# Patient Record
Sex: Male | Born: 1960 | Race: White | Hispanic: No | Smoking: Never smoker
Health system: Southern US, Community
[De-identification: ages and names within clinical notes are randomized; demographics above are authoritative.]

## PROBLEM LIST (undated history)

## (undated) DIAGNOSIS — D649 Anemia, unspecified: Secondary | ICD-10-CM

## (undated) DIAGNOSIS — D509 Iron deficiency anemia, unspecified: Secondary | ICD-10-CM

## (undated) DIAGNOSIS — K219 Gastro-esophageal reflux disease without esophagitis: Secondary | ICD-10-CM

## (undated) DIAGNOSIS — N200 Calculus of kidney: Secondary | ICD-10-CM

## (undated) DIAGNOSIS — I10 Essential (primary) hypertension: Secondary | ICD-10-CM

## (undated) HISTORY — DX: Anemia, unspecified: D64.9

## (undated) HISTORY — DX: Gastro-esophageal reflux disease without esophagitis: K21.9

## (undated) HISTORY — PX: OTHER SURGICAL HISTORY: SHX169

## (undated) HISTORY — DX: Iron deficiency anemia, unspecified: D50.9

## (undated) HISTORY — DX: Essential (primary) hypertension: I10

## (undated) HISTORY — DX: Calculus of kidney: N20.0

---

## 1999-02-13 ENCOUNTER — Ambulatory Visit (HOSPITAL_COMMUNITY): Admission: RE | Admit: 1999-02-13 | Discharge: 1999-02-13 | Payer: Self-pay | Admitting: Urology

## 1999-02-13 ENCOUNTER — Encounter: Payer: Self-pay | Admitting: Urology

## 2013-03-29 ENCOUNTER — Ambulatory Visit (INDEPENDENT_AMBULATORY_CARE_PROVIDER_SITE_OTHER): Payer: BC Managed Care – PPO | Admitting: Physician Assistant

## 2013-03-29 VITALS — BP 138/96 | HR 80 | Temp 97.4°F | Resp 20 | Ht 68.0 in | Wt 206.0 lb

## 2013-03-29 DIAGNOSIS — J309 Allergic rhinitis, unspecified: Secondary | ICD-10-CM

## 2013-03-29 MED ORDER — PREDNISONE 20 MG PO TABS: ORAL_TABLET | ORAL | Status: DC

## 2013-03-29 MED ORDER — AZELASTINE HCL 0.05 % OP SOLN: 1.0000 [drp] | Freq: Two times a day (BID) | OPHTHALMIC | Status: DC

## 2013-03-29 MED ORDER — CETIRIZINE HCL 10 MG PO TABS: 10.0000 mg | ORAL_TABLET | Freq: Every day | ORAL | Status: DC

## 2013-03-29 MED ORDER — FLUTICASONE PROPIONATE 50 MCG/ACT NA SUSP: 2.0000 | Freq: Every day | NASAL | Status: DC

## 2013-03-29 NOTE — Patient Instructions (Addendum)
Start the prednisone (oral steroid) today - take as directed and finish the full course.  This will calm down the allergic response you are having.  Take cetirizine (Zyrtec) every morning.  Also use the fluticasone (Flonase) every day - this medicine works best with consistent daily use.  Use the Optivar drops in both eyes twice daily to help with the itchy, watery eyes.  If any symptoms are worsening or not improving, please let us know   Allergic Rhinitis Allergic rhinitis is when the mucous membranes in the nose respond to allergens. Allergens are particles in the air that cause your body to have an allergic reaction. This causes you to release allergic antibodies. Through a chain of events, these eventually cause you to release histamine into the blood stream (hence the use of antihistamines). Although meant to be protective to the body, it is this release that causes your discomfort, such as frequent sneezing, congestion and an itchy runny nose.  CAUSES  The pollen allergens may come from grasses, trees, and weeds. This is seasonal allergic rhinitis, or "hay fever." Other allergens cause year-round allergic rhinitis (perennial allergic rhinitis) such as house dust mite allergen, pet dander and mold spores.  SYMPTOMS   Nasal stuffiness (congestion).  Runny, itchy nose with sneezing and tearing of the eyes.  There is often an itching of the mouth, eyes and ears. It cannot be cured, but it can be controlled with medications. DIAGNOSIS  If you are unable to determine the offending allergen, skin or blood testing may find it. TREATMENT   Avoid the allergen.  Medications and allergy shots (immunotherapy) can help.  Hay fever may often be treated with antihistamines in pill or nasal spray forms. Antihistamines block the effects of histamine. There are over-the-counter medicines that may help with nasal congestion and swelling around the eyes. Check with your caregiver before taking or  giving this medicine. If the treatment above does not work, there are many new medications your caregiver can prescribe. Stronger medications may be used if initial measures are ineffective. Desensitizing injections can be used if medications and avoidance fails. Desensitization is when a patient is given ongoing shots until the body becomes less sensitive to the allergen. Make sure you follow up with your caregiver if problems continue. SEEK MEDICAL CARE IF:   You develop fever (more than 100.5 F (38.1 C).  You develop a cough that does not stop easily (persistent).  You have shortness of breath.  You start wheezing.  Symptoms interfere with normal daily activities. Document Released: 04/07/2001 Document Revised: 10/05/2011 Document Reviewed: 10/17/2008 [facility] Patient Information 2014 [place], [place].

## 2013-03-29 NOTE — Progress Notes (Signed)
  Subjective:    Patient ID: Jackson Donaldson, male    DOB: 1961/06/10, 52 y.o.   MRN: [id number]  HPI   Jackson Donaldson is a very pleasant 52 yr old male here with concern for URI and/or allergies.  Reports he came down with a cold about 1 wk ago.  Think it's "turned into allergies."  Reports a history of sinus problems but has never been diagnosed with allergies.  Current symptoms include rhinorrhea, sneezing, itchy/watery eyes, ear pressure.  Has felt a little feverish but hasn't taken temp.  No HA or facial pain.  No ST or cough.  No wheezing or SOB.  Reports that being outside exacerbates symptoms.  Yesterday was out when someone was cutting grass and he experiences sneezing and worsening of eye symptoms.  Tried son's zyrtec yesterday - not sure if that helped.  Takes benadryl qhs for sleep at baseline.  Review of Systems  Constitutional: Positive for fever (subjective).  HENT: Positive for congestion, rhinorrhea and sneezing. Negative for ear pain, sore throat and sinus pressure.   Eyes: Positive for discharge, redness and itching.  Respiratory: Negative for cough, shortness of breath and wheezing.   Cardiovascular: Negative.   Gastrointestinal: Negative.   Musculoskeletal: Negative.   Skin: Negative.   Neurological: Negative.        Objective:   Physical Exam  Vitals reviewed. Constitutional: He is oriented to person, place, and time. He appears well-developed and well-nourished. No distress.  HENT:  Head: Normocephalic and atraumatic.  Right Ear: Tympanic membrane and ear canal normal.  Left Ear: Tympanic membrane and ear canal normal.  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 Right sinus exhibits no maxillary sinus tenderness and no frontal sinus tenderness. Left sinus exhibits no maxillary sinus tenderness and no frontal sinus tenderness.  Mouth/Throat: Uvula is midline, oropharynx is clear and moist and mucous membranes are normal.   Eyes: EOM are normal. Pupils are equal, round, and reactive to light. Right eye exhibits no exudate. Left eye exhibits no exudate. Right conjunctiva is injected. Left conjunctiva is injected.  Neck: Neck supple.  Cardiovascular: Normal rate, regular rhythm and normal heart sounds.   Pulmonary/Chest: Effort normal and breath sounds normal. He has no wheezes. He has no rales.  Lymphadenopathy:    He has no cervical adenopathy.  Neurological: He is alert and oriented to person, place, and time.  Skin: Skin is warm and dry.  Psychiatric: He has a normal mood and affect. His behavior is normal.        Assessment & Plan:  Allergic rhinitis - Plan: predniSONE (DELTASONE) 20 MG tablet, fluticasone (FLONASE) 50 MCG/ACT nasal spray, cetirizine (ZYRTEC) 10 MG tablet, azelastine (OPTIVAR) 0.05 % ophthalmic solution   Jackson Donaldson is a very pleasant 52 yr old male with allergic rhinitis.  No relief thus far with Zyrtec and Benadryl.  He is quite congested and having significant rhinorrhea and sneezing.  Additionally has allergic eye symptoms as well.  Will start prednisone taper today.  Also start Flonase and Optivar.  Continue daily Zyrtec and may continue nightly Benadryl.  Encouraged consistent daily use of Flonase for best results.  Discussed RTC precautions and the possibility of adding/changing allergy meds to optimize control of symptoms.  Pt understands and is in agreement with this plan.

## 2014-01-13 ENCOUNTER — Emergency Department (INDEPENDENT_AMBULATORY_CARE_PROVIDER_SITE_OTHER)
Admission: EM | Admit: 2014-01-13 | Discharge: 2014-01-13 | Disposition: A | Discharge status: Home / Self Care | Payer: Self-pay | Source: Home / Self Care | Attending: Family Medicine | Admitting: Family Medicine

## 2014-01-13 ENCOUNTER — Emergency Department (INDEPENDENT_AMBULATORY_CARE_PROVIDER_SITE_OTHER): Payer: Self-pay

## 2014-01-13 ENCOUNTER — Encounter (HOSPITAL_COMMUNITY): Payer: Self-pay | Admitting: Emergency Medicine

## 2014-01-13 DIAGNOSIS — F32A Depression, unspecified: Secondary | ICD-10-CM

## 2014-01-13 DIAGNOSIS — F3289 Other specified depressive episodes: Secondary | ICD-10-CM

## 2014-01-13 DIAGNOSIS — F329 Major depressive disorder, single episode, unspecified: Secondary | ICD-10-CM

## 2014-01-13 LAB — COMPREHENSIVE METABOLIC PANEL
ALT: 18 U/L (ref 0–53)
AST: 21 U/L (ref 0–37)
Albumin: 4.6 g/dL (ref 3.5–5.2)
Alkaline Phosphatase: 108 U/L (ref 39–117)
BILIRUBIN TOTAL: 0.7 mg/dL (ref 0.3–1.2)
BUN: 17 mg/dL (ref 6–23)
CHLORIDE: 100 meq/L (ref 96–112)
CO2: 22 meq/L (ref 19–32)
Calcium: 9.8 mg/dL (ref 8.4–10.5)
Creatinine, Ser: 0.96 mg/dL (ref 0.50–1.35)
GFR calc Af Amer: >90 mL/min (ref >90)
Glucose, Bld: 90 mg/dL (ref 70–99)
Potassium: 4 mEq/L (ref 3.7–5.3)
Sodium: 140 mEq/L (ref 137–147)
Total Protein: 9 g/dL — ABNORMAL HIGH (ref 6.0–8.3)

## 2014-01-13 LAB — CBC WITH DIFFERENTIAL/PLATELET
BASOS ABS: 0 10*3/uL (ref 0.0–0.1)
Basophils Relative: 0 % (ref 0–1)
Eosinophils Absolute: 0.1 10*3/uL (ref 0.0–0.7)
Eosinophils Relative: 1 % (ref 0–5)
HCT: 46.2 % (ref 39.0–52.0)
Hemoglobin: 16 g/dL (ref 13.0–17.0)
LYMPHS ABS: 1.2 10*3/uL (ref 0.7–4.0)
LYMPHS PCT: 24 % (ref 12–46)
MCH: 29.7 pg (ref 26.0–34.0)
MCHC: 34.6 g/dL (ref 30.0–36.0)
MCV: 85.7 fL (ref 78.0–100.0)
MONO ABS: 0.4 10*3/uL (ref 0.1–1.0)
Monocytes Relative: 7 % (ref 3–12)
NEUTROS ABS: 3.4 10*3/uL (ref 1.7–7.7)
Neutrophils Relative %: 68 % (ref 43–77)
Platelets: 197 10*3/uL (ref 150–400)
RBC: 5.39 MIL/uL (ref 4.22–5.81)
RDW: 14.1 % (ref 11.5–15.5)
WBC: 5 10*3/uL (ref 4.0–10.5)

## 2014-01-13 LAB — HEMOGLOBIN A1C
HEMOGLOBIN A1C: 5.6 % (ref <5.7)
Mean Plasma Glucose: 114 mg/dL (ref <117)

## 2014-01-13 LAB — POCT URINALYSIS DIP (DEVICE)
Bilirubin Urine: NEGATIVE
Glucose, UA: NEGATIVE mg/dL
Hgb urine dipstick: NEGATIVE
LEUKOCYTES UA: NEGATIVE
Nitrite: NEGATIVE
PH: 5.5 (ref 5.0–8.0)
PROTEIN: 30 mg/dL — AB
Specific Gravity, Urine: >=1.03 (ref 1.005–1.030)
Urobilinogen, UA: 0.2 mg/dL (ref 0.0–1.0)

## 2014-01-13 LAB — SEDIMENTATION RATE: Sed Rate: 1 mm/hr (ref 0–16)

## 2014-01-13 MED ORDER — CITALOPRAM HYDROBROMIDE 20 MG PO TABS: 20.0000 mg | ORAL_TABLET | Freq: Every day | ORAL | Status: DC

## 2014-01-13 NOTE — Discharge Instructions (Signed)
Depression, Adult Depression refers to feeling sad, low, down in the dumps, blue, gloomy, or empty. In general, there are two kinds of depression: 1. Depression that we all experience from time to time because of upsetting life experiences, including the loss of a job or the ending of a relationship (normal sadness or normal grief). This kind of depression is considered normal, is short lived, and resolves within a few days to 2 weeks. (Depression experienced after the loss of a loved one is called bereavement. Bereavement often lasts longer than 2 weeks but normally gets better with time.) 2. Clinical depression, which lasts longer than normal sadness or normal grief or interferes with your ability to function at home, at work, and in school. It also interferes with your personal relationships. It affects almost every aspect of your life. Clinical depression is an illness. Symptoms of depression also can be caused by conditions other than normal sadness and grief or clinical depression. Examples of these conditions are listed as follows:  Physical illness--Some physical illnesses, including underactive thyroid gland (hypothyroidism), severe anemia, specific types of cancer, diabetes, uncontrolled seizures, heart and lung problems, strokes, and chronic pain are commonly associated with symptoms of depression.  Side effects of some prescription medicine--In some people, certain types of prescription medicine can cause symptoms of depression.  Substance abuse--Abuse of alcohol and illicit drugs can cause symptoms of depression. SYMPTOMS Symptoms of normal sadness and normal grief include the following:  Feeling sad or crying for short periods of time.  Not caring about anything (apathy).  Difficulty sleeping or sleeping too much.  No longer able to enjoy the things you used to enjoy.  Desire to be by oneself all the time (social isolation).  Lack of energy or motivation.  Difficulty  concentrating or remembering.  Change in appetite or weight.  Restlessness or agitation. Symptoms of clinical depression include the same symptoms of normal sadness or normal grief and also the following symptoms:  Feeling sad or crying all the time.  Feelings of guilt or worthlessness.  Feelings of hopelessness or helplessness.  Thoughts of suicide or the desire to harm yourself (suicidal ideation).  Loss of touch with reality (psychotic symptoms). Seeing or hearing things that are not real (hallucinations) or having false beliefs about your life or the people around you (delusions and paranoia). DIAGNOSIS  The diagnosis of clinical depression usually is based on the severity and duration of the symptoms. Your caregiver also will ask you questions about your medical history and substance use to find out if physical illness, use of prescription medicine, or substance abuse is causing your depression. Your caregiver also may order blood tests. TREATMENT  Typically, normal sadness and normal grief do not require treatment. However, sometimes antidepressant medicine is prescribed for bereavement to ease the depressive symptoms until they resolve. The treatment for clinical depression depends on the severity of your symptoms but typically includes antidepressant medicine, counseling with a mental health professional, or a combination of both. Your caregiver will help to determine what treatment is best for you. Depression caused by physical illness usually goes away with appropriate medical treatment of the illness. If prescription medicine is causing depression, talk with your caregiver about stopping the medicine, decreasing the dose, or substituting another medicine. Depression caused by abuse of alcohol or illicit drugs abuse goes away with abstinence from these substances. Some adults need professional help in order to stop drinking or using drugs. SEEK IMMEDIATE CARE IF:  You have thoughts  about   hurting yourself or others.  You lose touch with reality (have psychotic symptoms).  You are taking medicine for depression and have a serious side effect. FOR MORE INFORMATION National Alliance on Mental Illness: www.nami.org National Institute of Mental Health: www.nimh.nih.gov Document Released: 07/10/2000 Document Revised: 01/12/2012 Document Reviewed: 10/12/2011 [employer] Patient Information 2015 [employer]. This information is not intended to replace advice given to you by your health care provider. Make sure you discuss any questions you have with your health care provider.  

## 2014-01-13 NOTE — ED Provider Notes (Signed)
CSN: [id number]     Arrival date & time 01/13/14  1011 History   First MD Initiated Contact with Patient 01/13/14 1132     Chief Complaint  Patient presents with  . Headache  . Chest Pain   (Consider location/radiation/quality/duration/timing/severity/associated sxs/prior Treatment) HPI Comments: 53 year old male presents complaining of several months of fatigue, depression, dry mouth, intermittent chest tightness, headaches, sleepiness, intermittent alcohol abuse, dizziness when standing from a seated position. These symptoms have all been intermittent he separated from his wife of 35 years. He thinks that he may be depressed. He denies any other significant past medical history. He does not have a primary care doctor.  Patient is a 53 y.o. male presenting with headaches and chest pain.  Headache Chest Pain Associated symptoms: headache     History reviewed. No pertinent past medical history. History reviewed. No pertinent past surgical history. Family History  Problem Relation Age of Onset  . Diabetes Mother    History  Substance Use Topics  . Smoking status: Never Smoker   . Smokeless tobacco: Not on file  . Alcohol Use: Yes    Review of Systems  Cardiovascular: Positive for chest pain.  Neurological: Positive for headaches.    Allergies  Review of patient's allergies indicates no known allergies.  Home Medications   Prior to Admission medications   Medication Sig Start Date End Date Taking? Authorizing Provider  azelastine (OPTIVAR) 0.05 % ophthalmic solution Place 1 drop into both eyes 2 (two) times daily. 03/29/13   Eleanore Kurtis Bushman, PA-C  cetirizine (ZYRTEC) 10 MG tablet Take 1 tablet (10 mg total) by mouth daily. 03/29/13   Eleanore Kurtis Bushman, PA-C  citalopram (CELEXA) 20 MG tablet Take 1 tablet (20 mg total) by mouth daily. 01/13/14   Freeman Caldron Clary Boulais, PA-C  fluticasone (FLONASE) 50 MCG/ACT nasal spray Place 2 sprays into the nose daily. 03/29/13   Eleanore Kurtis Bushman, PA-C   predniSONE (DELTASONE) 20 MG tablet Take 3 PO QAM x3days, 2 PO QAM x3days, 1 PO QAM x3days 03/29/13   Eleanore E Egan, PA-C   BP 173/110  Pulse 80  Temp(Src) 98.9 F (37.2 C) (Oral)  Resp 22  SpO2 100% Physical Exam  ED Course  Procedures (including critical care time) Labs Review Labs Reviewed  COMPREHENSIVE METABOLIC PANEL - Abnormal; Notable for the following:    Total Protein 9.0 (*)    All other components within normal limits  POCT URINALYSIS DIP (DEVICE) - Abnormal; Notable for the following:    Ketones, ur TRACE (*)    Protein, ur 30 (*)    All other components within normal limits  CBC WITH DIFFERENTIAL  SEDIMENTATION RATE  HEMOGLOBIN A1C    Imaging Review Dg Chest 2 View  01/13/2014   CLINICAL DATA:  Intermittent chest pain for 6-8 weeks, nonsmoker  EXAM: CHEST  2 VIEW  COMPARISON:  None.  FINDINGS: Heart size and vascular pattern are normal. Lungs are clear except for bilateral nipple shadows. No pleural effusion. Bony thorax intact.  IMPRESSION: No active cardiopulmonary disease.   Electronically Signed   By: Skipper Cliche M.D.   On: 01/13/2014 12:05    EKG: NSR, nonischemic   MDM   1. Depression    Labs are normal, EKG and x-ray is normal, EKG is normal. Awaiting results of the A1c and ESR. Discharge with prescription for Celexa 20 mg daily, followup with primary care. Discussed options for finding a primary care provider   Meds ordered this encounter  Medications  . citalopram (CELEXA) 20 MG tablet    Sig: Take 1 tablet (20 mg total) by mouth daily.    Dispense:  30 tablet    Refill:  1    Order Specific Question:  Supervising Provider    Answer:  Ihor Gully D [[id number]]       Liam Graham, PA-C 01/13/14 1246

## 2014-01-13 NOTE — ED Notes (Signed)
Patient has multiple complaints:  Reports 2 month history of intermittent squeezing of chest, headaches, dry mouth.  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  Patient currently admits to drinking 12 beers a week.  Patient has a history of drinking heavier at times.  Patient has no pcp.  Today repeatedly mentions dry mouth, falling asleep throughout the day, and intermittent dizziness

## 2014-01-16 NOTE — ED Provider Notes (Signed)
Medical screening examination/treatment/procedure(s) were performed by resident physician or non-physician practitioner and as supervising physician I was immediately available for consultation/collaboration.   KINDL,JAMES DOUGLAS MD.   James D Kindl, MD 01/16/14 1505 

## 2019-09-06 ENCOUNTER — Other Ambulatory Visit: Payer: Self-pay

## 2019-09-06 ENCOUNTER — Encounter: Payer: Self-pay | Admitting: Family Medicine

## 2019-09-06 ENCOUNTER — Ambulatory Visit: Payer: 59 | Admitting: Family Medicine

## 2019-09-06 VITALS — BP 140/90 | HR 81 | Temp 98.4°F | Wt 193.6 lb

## 2019-09-06 DIAGNOSIS — R809 Proteinuria, unspecified: Secondary | ICD-10-CM

## 2019-09-06 DIAGNOSIS — M5440 Lumbago with sciatica, unspecified side: Secondary | ICD-10-CM

## 2019-09-06 DIAGNOSIS — M79672 Pain in left foot: Secondary | ICD-10-CM

## 2019-09-06 DIAGNOSIS — I10 Essential (primary) hypertension: Secondary | ICD-10-CM | POA: Diagnosis not present

## 2019-09-06 DIAGNOSIS — G8929 Other chronic pain: Secondary | ICD-10-CM

## 2019-09-06 DIAGNOSIS — H9319 Tinnitus, unspecified ear: Secondary | ICD-10-CM

## 2019-09-06 DIAGNOSIS — R0981 Nasal congestion: Secondary | ICD-10-CM

## 2019-09-06 DIAGNOSIS — J342 Deviated nasal septum: Secondary | ICD-10-CM | POA: Insufficient documentation

## 2019-09-06 LAB — POCT URINALYSIS DIP (PROADVANTAGE DEVICE)
Blood, UA: NEGATIVE
Glucose, UA: NEGATIVE mg/dL
Ketones, POC UA: NEGATIVE mg/dL
Leukocytes, UA: NEGATIVE
Nitrite, UA: NEGATIVE
Specific Gravity, Urine: 1.03
Urobilinogen, Ur: NEGATIVE
pH, UA: 6 (ref 5.0–8.0)

## 2019-09-06 MED ORDER — METHOCARBAMOL 500 MG PO TABS: 500.0000 mg | ORAL_TABLET | Freq: Four times a day (QID) | ORAL | 1 refills | Status: DC

## 2019-09-06 MED ORDER — LISINOPRIL-HYDROCHLOROTHIAZIDE 10-12.5 MG PO TABS: 1.0000 | ORAL_TABLET | Freq: Every day | ORAL | 1 refills | Status: DC

## 2019-09-06 NOTE — Progress Notes (Signed)
Subjective:    Patient ID: Jackson Donaldson, male    DOB: Aug 11, 1960, 59 y.o.   MRN: [id number]  HPI Chief Complaint  Patient presents with  . get established.    new pt get established. sinus issues- always had this, born with sinus issues. low back issues- years.  bp has been elevated in the years past and was on bp meds   He is new to the practice and here to establish care.  He has multiple concerns. Previous medical care: no regular care 12-14 years ago  HTN-diagnosed approximately 12 years ago  States he took medication for a couple of years but has been off any blood pressure medication for more than 10 years. Does not check his blood pressure at home   States he has a deviated septum and it runs in his family.  States he has been having a lot of drainage, difficulty breathing of the left side of his nose.  Sister had surgery on her nose for this issue and he would like to see a specialist.   Low back pain for years. States he can no longer bend down to tie his shoes. Pain occasionally radiates down the back of one of his legs but he cannot recall which one.  States his back pain does not keep him from doing his job and functioning.  He denies numbness, tingling or weakness.  No fever, chills, unexplained weight loss, urinary symptoms. States he takes a friend's muscle relaxant in the evenings as needed.  States he has seen a chiropractor in the past and this helped a lot.   Reports having kidney stones in the past on 2 occasions and the last one was over 20 years ago   History of protein in his urine.   States he does not get the flu shot.   Denies smoking, drug use. Drinks alcohol occasionally.   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  Divorced. 2 kids ages 55 and 67   His sister is a Marine scientist in [place] at surgical center.   States he has history of tinnitus     Review of Systems Pertinent positives and negatives in the history of present illness.       Objective:   Physical Exam BP 140/90   Pulse 81   Temp 98.4 F (36.9 C)   Wt 193 lb 9.6 oz (87.8 kg)   BMI 29.44 kg/m   Alert and in no distress.  Nasal exam shows a deviated septum with left-sided hypertrophy. cardiac exam shows a regular rhythm without murmurs or gallops. Lungs are clear to auscultation.  Extremities without edema.  Skin is warm and dry.        Assessment & Plan:  Essential hypertension - Plan: CBC with Differential/Platelet, Comprehensive metabolic panel, EKG 37-JIRC, lisinopril-hydrochlorothiazide (ZESTORETIC) 10-12.5 MG tablet -He is a pleasant 59 year old male who is here today as a new patient.  No regular medical care in at least 12 years.  History of hypertension and took medication in the distant past.  His initial blood pressure was 150/100.  Recheck at the end of the visit showed improvement.  I will start him on medication.  Counseling on low-sodium diet and exercise to help lower blood pressure.  DASH diet handout given.  Encouraged him to purchase a blood pressure cuff and start checking his blood pressure at home.  He will follow-up in 4 weeks.  I encouraged him to bring in his readings and machine  Deviated septum - Plan: Ambulatory referral to ENT -His symptoms bothersome and referral to ENT per his request  Chronic bilateral low back pain with sciatica, sciatica laterality unspecified - Plan: DG Lumbar Spine Complete, methocarbamol (ROBAXIN) 500 MG tablet -recommend he avoid taking other people's medication. I will send him for a LS X ray and prescribe Robaxin for him. Offered PT and he will let me know if he would like to try this or have a referral to ortho  Chronic foot pain, left - Plan: DG Foot Complete Left -This sounds very much like plantar fasciitis but concerning that has been going on for more than 2 years.  We will get an x-ray.  Discussed conservative treatment including avoiding walking barefoot and to wear good supportive shoes.   Discussed using ice massage to plantar surface as well as stretching and wearing braces at night.  Discussed that he may need to see a podiatrist or go to the good feet store to determine if he needs orthotics.  Proteinuria, unspecified type - Plan: POCT Urinalysis DIP (Proadvantage Device) -UA shows a trace of protein  Chronic nasal congestion - Plan: Ambulatory referral to ENT   Tinnitus, unspecified laterality -we will spend more time on this at his next visit since he mentioned it at the end. 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

## 2019-09-06 NOTE — Patient Instructions (Addendum)
Start on the blood pressure pill once daily.   Buy a blood pressure cuff (for your upper arm, not a wrist one) and start checking your blood pressure at home.  Goal BP is 130/80 or lower.   You should hear from the ENT about your nose.   Get the low back and left X rays at [facility] as discussed.   I sent a muscle relaxant to your pharmacy. Be careful in case this makes you sleepy.   We can refer you to physical therapy if you would like or an orthopedist.   For your foot, avoid walking barefoot. Use an ice to massage the sole of your foot. Stretch your toes.  You may want to see a foot specialist or go to a good foot store to see if you need an orthotic.   We will be in touch with your results.     DASH Eating Plan DASH stands for "Dietary Approaches to Stop Hypertension." The DASH eating plan is a healthy eating plan that has been shown to reduce high blood pressure (hypertension). It may also reduce your risk for type 2 diabetes, heart disease, and stroke. The DASH eating plan may also help with weight loss. What are tips for following this plan?  General guidelines  Avoid eating more than 2,300 mg (milligrams) of salt (sodium) a day. If you have hypertension, you may need to reduce your sodium intake to 1,500 mg a day.  Limit alcohol intake to no more than 1 drink a day for nonpregnant women and 2 drinks a day for men. One drink equals 12 oz of beer, 5 oz of wine, or 1 oz of hard liquor.  Work with your health care provider to maintain a healthy body weight or to lose weight. Ask what an ideal weight is for you.  Get at least 30 minutes of exercise that causes your heart to beat faster (aerobic exercise) most days of the week. Activities may include walking, swimming, or biking.  Work with your health care provider or diet and nutrition specialist (dietitian) to adjust your eating plan to your individual calorie needs. Reading food labels   Check food labels for  the amount of sodium per serving. Choose foods with less than 5 percent of the Daily Value of sodium. Generally, foods with less than 300 mg of sodium per serving fit into this eating plan.  To find whole grains, look for the word "whole" as the first word in the ingredient list. Shopping  Buy products labeled as "low-sodium" or "no salt added."  Buy fresh foods. Avoid canned foods and premade or frozen meals. Cooking  Avoid adding salt when cooking. Use salt-free seasonings or herbs instead of table salt or sea salt. Check with your health care provider or pharmacist before using salt substitutes.  Do not fry foods. Cook foods using healthy methods such as baking, boiling, grilling, and broiling instead.  Cook with heart-healthy oils, such as olive, canola, soybean, or sunflower oil. Meal planning  Eat a balanced diet that includes: ? 5 or more servings of fruits and vegetables each day. At each meal, try to fill half of your plate with fruits and vegetables. ? Up to 6-8 servings of whole grains each day. ? Less than 6 oz of lean meat, poultry, or fish each day. A 3-oz serving of meat is about the same size as a deck of cards. One egg equals 1 oz. ? 2 servings of low-fat dairy each day. ?  A serving of nuts, seeds, or beans 5 times each week. ? Heart-healthy fats. Healthy fats called Omega-3 fatty acids are found in foods such as flaxseeds and coldwater fish, like sardines, salmon, and mackerel.  Limit how much you eat of the following: ? Canned or prepackaged foods. ? Food that is high in trans fat, such as fried foods. ? Food that is high in saturated fat, such as fatty meat. ? Sweets, desserts, sugary drinks, and other foods with added sugar. ? Full-fat dairy products.  Do not salt foods before eating.  Try to eat at least 2 vegetarian meals each week.  Eat more home-cooked food and less restaurant, buffet, and fast food.  When eating at a restaurant, ask that your food be  prepared with less salt or no salt, if possible. What foods are recommended? The items listed may not be a complete list. Talk with your dietitian about what dietary choices are best for you. Grains Whole-grain or whole-wheat bread. Whole-grain or whole-wheat pasta. Brown rice. Orpah Cobb. Bulgur. Whole-grain and low-sodium cereals. Pita bread. Low-fat, low-sodium crackers. Whole-wheat flour tortillas. Vegetables Fresh or frozen vegetables (raw, steamed, roasted, or grilled). Low-sodium or reduced-sodium tomato and vegetable juice. Low-sodium or reduced-sodium tomato sauce and tomato paste. Low-sodium or reduced-sodium canned vegetables. Fruits All fresh, dried, or frozen fruit. Canned fruit in natural juice (without added sugar). Meat and other protein foods Skinless chicken or [place]. Ground chicken or [place]. Pork with fat trimmed off. Fish and seafood. Egg whites. Dried beans, peas, or lentils. Unsalted nuts, nut butters, and seeds. Unsalted canned beans. Lean cuts of beef with fat trimmed off. Low-sodium, lean deli meat. Dairy Low-fat (1%) or fat-free (skim) milk. Fat-free, low-fat, or reduced-fat cheeses. Nonfat, low-sodium ricotta or cottage cheese. Low-fat or nonfat yogurt. Low-fat, low-sodium cheese. Fats and oils Soft margarine without trans fats. Vegetable oil. Low-fat, reduced-fat, or light mayonnaise and salad dressings (reduced-sodium). Canola, safflower, olive, soybean, and sunflower oils. Avocado. Seasoning and other foods Herbs. Spices. Seasoning mixes without salt. Unsalted popcorn and pretzels. Fat-free sweets. What foods are not recommended? The items listed may not be a complete list. Talk with your dietitian about what dietary choices are best for you. Grains Baked goods made with fat, such as croissants, muffins, or some breads. Dry pasta or rice meal packs. Vegetables Creamed or fried vegetables. Vegetables in a cheese sauce. Regular canned vegetables (not  low-sodium or reduced-sodium). Regular canned tomato sauce and paste (not low-sodium or reduced-sodium). Regular tomato and vegetable juice (not low-sodium or reduced-sodium). Rosita Fire. Olives. Fruits Canned fruit in a light or heavy syrup. Fried fruit. Fruit in cream or butter sauce. Meat and other protein foods Fatty cuts of meat. Ribs. Fried meat. Tomasa Blase. Sausage. Bologna and other processed lunch meats. Salami. Fatback. Hotdogs. Bratwurst. Salted nuts and seeds. Canned beans with added salt. Canned or smoked fish. Whole eggs or egg yolks. Chicken or [place] with skin. Dairy Whole or 2% milk, cream, and half-and-half. Whole or full-fat cream cheese. Whole-fat or sweetened yogurt. Full-fat cheese. Nondairy creamers. Whipped toppings. Processed cheese and cheese spreads. Fats and oils Butter. Stick margarine. Lard. Shortening. Ghee. Bacon fat. Tropical oils, such as coconut, palm kernel, or palm oil. Seasoning and other foods Salted popcorn and pretzels. Onion salt, garlic salt, seasoned salt, table salt, and sea salt. Worcestershire sauce. Tartar sauce. Barbecue sauce. Teriyaki sauce. Soy sauce, including reduced-sodium. Steak sauce. Canned and packaged gravies. Fish sauce. Oyster sauce. Cocktail sauce. Horseradish that you find on the shelf. Ketchup.  Mustard. Meat flavorings and tenderizers. Bouillon cubes. Hot sauce and Tabasco sauce. Premade or packaged marinades. Premade or packaged taco seasonings. Relishes. Regular salad dressings. Where to find more information:  National Heart, Lung, and Blood Institute: PopSteam.is  American Heart Association: www.heart.org Summary  The DASH eating plan is a healthy eating plan that has been shown to reduce high blood pressure (hypertension). It may also reduce your risk for type 2 diabetes, heart disease, and stroke.  With the DASH eating plan, you should limit salt (sodium) intake to 2,300 mg a day. If you have hypertension, you may need to reduce  your sodium intake to 1,500 mg a day.  When on the DASH eating plan, aim to eat more fresh fruits and vegetables, whole grains, lean proteins, low-fat dairy, and heart-healthy fats.  Work with your health care provider or diet and nutrition specialist (dietitian) to adjust your eating plan to your individual calorie needs. This information is not intended to replace advice given to you by your health care provider. Make sure you discuss any questions you have with your health care provider. Document Revised: 06/25/2017 Document Reviewed: 07/06/2016 Elsevier Patient Education  2020 [employer].

## 2019-09-07 LAB — CBC WITH DIFFERENTIAL/PLATELET
Basophils Absolute: 0.1 10*3/uL (ref 0.0–0.2)
Basos: 1 %
EOS (ABSOLUTE): 0.3 10*3/uL (ref 0.0–0.4)
Eos: 4 %
Hematocrit: 36.7 % — ABNORMAL LOW (ref 37.5–51.0)
Hemoglobin: 11.4 g/dL — ABNORMAL LOW (ref 13.0–17.7)
Immature Grans (Abs): 0 10*3/uL (ref 0.0–0.1)
Immature Granulocytes: 0 %
Lymphocytes Absolute: 1.8 10*3/uL (ref 0.7–3.1)
Lymphs: 27 %
MCH: 23 pg — ABNORMAL LOW (ref 26.6–33.0)
MCHC: 31.1 g/dL — ABNORMAL LOW (ref 31.5–35.7)
MCV: 74 fL — ABNORMAL LOW (ref 79–97)
Monocytes Absolute: 0.6 10*3/uL (ref 0.1–0.9)
Monocytes: 10 %
Neutrophils Absolute: 3.8 10*3/uL (ref 1.4–7.0)
Neutrophils: 58 %
Platelets: 305 10*3/uL (ref 150–450)
RBC: 4.96 x10E6/uL (ref 4.14–5.80)
RDW: 15.3 % (ref 11.6–15.4)
WBC: 6.6 10*3/uL (ref 3.4–10.8)

## 2019-09-07 LAB — COMPREHENSIVE METABOLIC PANEL
ALT: 21 IU/L (ref 0–44)
AST: 28 IU/L (ref 0–40)
Albumin/Globulin Ratio: 1 — ABNORMAL LOW (ref 1.2–2.2)
Albumin: 4.1 g/dL (ref 3.8–4.9)
Alkaline Phosphatase: 104 IU/L (ref 39–117)
BUN/Creatinine Ratio: 19 (ref 9–20)
BUN: 24 mg/dL (ref 6–24)
Bilirubin Total: 0.4 mg/dL (ref 0.0–1.2)
CO2: 21 mmol/L (ref 20–29)
Calcium: 9.3 mg/dL (ref 8.7–10.2)
Chloride: 101 mmol/L (ref 96–106)
Creatinine, Ser: 1.29 mg/dL — ABNORMAL HIGH (ref 0.76–1.27)
GFR calc Af Amer: 70 mL/min/{1.73_m2} (ref >59)
GFR calc non Af Amer: 60 mL/min/{1.73_m2} (ref >59)
Globulin, Total: 4.2 g/dL (ref 1.5–4.5)
Glucose: 91 mg/dL (ref 65–99)
Potassium: 5 mmol/L (ref 3.5–5.2)
Sodium: 135 mmol/L (ref 134–144)
Total Protein: 8.3 g/dL (ref 6.0–8.5)

## 2019-09-07 NOTE — Progress Notes (Signed)
Please ask Jackson Donaldson to add on iron studies due to microcytic anemia.

## 2019-09-08 ENCOUNTER — Other Ambulatory Visit: Payer: Self-pay | Admitting: Internal Medicine

## 2019-09-08 DIAGNOSIS — D649 Anemia, unspecified: Secondary | ICD-10-CM

## 2019-09-08 NOTE — Progress Notes (Signed)
His blood count is a little low, he has mild anemia. He really should not have anemia so it makes me concerned that he could have bleeding in his colon. When did he last have a colonoscopy or has he ever? I recommend that he see GI to consider an evaluation for anemia.

## 2019-09-11 ENCOUNTER — Encounter: Payer: Self-pay | Admitting: Gastroenterology

## 2019-09-11 LAB — IRON AND TIBC
Iron Saturation: 10 % — ABNORMAL LOW (ref 15–55)
Iron: 45 ug/dL (ref 38–169)
Total Iron Binding Capacity: 456 ug/dL — ABNORMAL HIGH (ref 250–450)
UIBC: 411 ug/dL — ABNORMAL HIGH (ref 111–343)

## 2019-09-11 LAB — SPECIMEN STATUS REPORT

## 2019-09-11 LAB — FERRITIN: Ferritin: 14 ng/mL — ABNORMAL LOW (ref 30–400)

## 2019-09-11 NOTE — Progress Notes (Signed)
Please call and let him know that GI has called him and that I strongly encourage him to follow up with them regarding his anemia.

## 2019-09-15 ENCOUNTER — Encounter (INDEPENDENT_AMBULATORY_CARE_PROVIDER_SITE_OTHER): Payer: Self-pay | Admitting: Otolaryngology

## 2019-09-15 ENCOUNTER — Ambulatory Visit (INDEPENDENT_AMBULATORY_CARE_PROVIDER_SITE_OTHER): Payer: 59 | Admitting: Otolaryngology

## 2019-09-15 ENCOUNTER — Other Ambulatory Visit: Payer: Self-pay

## 2019-09-15 VITALS — Temp 97.9°F

## 2019-09-15 DIAGNOSIS — J3489 Other specified disorders of nose and nasal sinuses: Secondary | ICD-10-CM | POA: Diagnosis not present

## 2019-09-15 DIAGNOSIS — J342 Deviated nasal septum: Secondary | ICD-10-CM | POA: Diagnosis not present

## 2019-09-15 NOTE — Progress Notes (Signed)
HPI: Jackson Donaldson is a 59 y.o. male who presents is referred by his PCP for evaluation of deviated septum and chronic sinus problems.  He apparently has had a deviated septum his whole life but as he is gotten older it is cause more problems for him.  He describes chronic drainage from his nose as well as drying and crusting on the right side.  He does have allergies.  He denies any trauma to his nose or previous nasal fracture although he has a very crooked nose. Denies any yellow-green discharge from his nose.Krystal Clark no history of cardiac disease.  Only medication he takes is for hypertension as well as occasional Flexeril for his back. He works as a Dealer. NKDA  No past medical history on file. No past surgical history on file. Social History   Socioeconomic History  . Marital status: Divorced    Spouse name: Not on file  . Number of children: Not on file  . Years of education: Not on file  . Highest education level: Not on file  Occupational History  . Not on file  Tobacco Use  . Smoking status: Never Smoker  . Smokeless tobacco: Never Used  Substance and Sexual Activity  . Alcohol use: Yes  . Drug use: No  . Sexual activity: Not on file  Other Topics Concern  . Not on file  Social History Narrative  . Not on file   Social Determinants of Health   Financial Resource Strain:   . Difficulty of Paying Living Expenses: Not on file  Food Insecurity:   . Worried About Charity fundraiser in the Last Year: Not on file  . Ran Out of Food in the Last Year: Not on file  Transportation Needs:   . Lack of Transportation (Medical): Not on file  . Lack of Transportation (Non-Medical): Not on file  Physical Activity:   . Days of Exercise per Week: Not on file  . Minutes of Exercise per Session: Not on file  Stress:   . Feeling of Stress : Not on file  Social Connections:   . Frequency of Communication with Friends and Family: Not on file  . Frequency of Social Gatherings  with Friends and Family: Not on file  . Attends Religious Services: Not on file  . Active Member of Clubs or Organizations: Not on file  . Attends Archivist Meetings: Not on file  . Marital Status: Not on file   Family History  Problem Relation Age of Onset  . Diabetes Mother    No Known Allergies Prior to Admission medications   Medication Sig Start Date End Date Taking? Authorizing Provider  lisinopril-hydrochlorothiazide (ZESTORETIC) 10-12.5 MG tablet Take 1 tablet by mouth daily. 09/06/19  Yes Henson, Vickie L, NP-C  methocarbamol (ROBAXIN) 500 MG tablet Take 1 tablet (500 mg total) by mouth 4 (four) times daily. 09/06/19  Yes Henson, Vickie L, NP-C     Positive ROS: Otherwise negative  All other systems have been reviewed and were otherwise negative with the exception of those mentioned in the HPI and as above.  Physical Exam: Constitutional: Alert, well-appearing, no acute distress Ears: External ears without lesions or tenderness. Ear canals are clear bilaterally with intact, clear TMs.  Nasal: External nose is deviated to the left.. Septum is severely deviated to the left with complete obstruction of the left nasal passageway..  Right nasal passageway is clear.  Middle meatus regions clear no polyps noted.  Endoscopy on the  left side revealed no evidence of polyps in the middle meatus region was clear. Oral: Lips and gums without lesions. Tongue and palate mucosa without lesions. Posterior oropharynx clear. Neck: No palpable adenopathy or masses Respiratory: Breathing comfortably.  Lungs clear to auscultation bilaterally. Cardiac exam: Regular rate and rhythm without murmur Skin: No facial/neck lesions or rash noted.  Procedures  Assessment: Severely deviated nasal septum and external nose. 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 He will check with his work and call us back to schedule this.   Narda Bonds, MD   CC:

## 2019-10-02 ENCOUNTER — Ambulatory Visit
Admission: RE | Admit: 2019-10-02 | Discharge: 2019-10-02 | Disposition: A | Discharge status: Home / Self Care | Payer: 59 | Source: Ambulatory Visit | Attending: Family Medicine | Admitting: Family Medicine

## 2019-10-02 DIAGNOSIS — G8929 Other chronic pain: Secondary | ICD-10-CM

## 2019-10-03 NOTE — Progress Notes (Signed)
His low back XR shows mild degenerative changes. If this becomes more bothersome, let me know.

## 2019-10-03 NOTE — Progress Notes (Signed)
His foot XR shows some mild degenerative changes (wear and tear). If this continues to bother him we can always refer him to a foot specialist. He may also want to try going to a shoe store where they can make sure he is in the right fitting shoe with support he needs such as the [employer], [employer] and there are others.

## 2019-10-04 ENCOUNTER — Ambulatory Visit: Payer: 59 | Admitting: Family Medicine

## 2019-10-04 ENCOUNTER — Other Ambulatory Visit: Payer: Self-pay

## 2019-10-04 ENCOUNTER — Encounter: Payer: Self-pay | Admitting: Family Medicine

## 2019-10-04 VITALS — BP 130/78 | HR 83 | Temp 96.9°F | Wt 192.0 lb

## 2019-10-04 DIAGNOSIS — Z9189 Other specified personal risk factors, not elsewhere classified: Secondary | ICD-10-CM | POA: Insufficient documentation

## 2019-10-04 DIAGNOSIS — I10 Essential (primary) hypertension: Secondary | ICD-10-CM | POA: Diagnosis not present

## 2019-10-04 DIAGNOSIS — D649 Anemia, unspecified: Secondary | ICD-10-CM | POA: Insufficient documentation

## 2019-10-04 DIAGNOSIS — R0681 Apnea, not elsewhere classified: Secondary | ICD-10-CM | POA: Insufficient documentation

## 2019-10-04 DIAGNOSIS — R7989 Other specified abnormal findings of blood chemistry: Secondary | ICD-10-CM | POA: Insufficient documentation

## 2019-10-04 DIAGNOSIS — R0683 Snoring: Secondary | ICD-10-CM | POA: Insufficient documentation

## 2019-10-04 NOTE — Progress Notes (Signed)
Subjective:    Patient ID: Jackson Donaldson, male    DOB: August 04, 1960, 59 y.o.   MRN: [id number]  HPI Chief Complaint  Patient presents with  . 4 week follow-up on HTN    follow-up on HTN,    He is fairly new to me and here to follow up on several chronic health conditions. He  also has a new complaint of possible sleep apnea.   HTN- doing fine on lisinopril- HCTZ daily. Has not been checking his BP at home. Initially he had headache but not sure if this was related to medication or not. Headaches resolved.   IDA- states his nose bleeds a lot due to severely deviated septum which he saw Dr. Lucia Gaskins for last month. Needs nasal surgery to fix this.  He also reports having BRBPR and thinks this is due to hemorrhoids. States his last colonoscopy was at age 67.  Has not been taking oral iron yet.   Of note, he has an appointment with Dr. Loletha Carrow tomorrow.   Denies fever, chills, dizziness, chest pain, palpitations, shortness of breath, abdominal pain, N/V/D, urinary symptoms, LE edema.    Chronic back and foot pain have improved. He declines PT at this point.   Elevated serum creatinine  States he does not drink water. Drinks beer on weekends, sweet tea and soda in the evening   Works as Ship broker of Water quality scientist.   Has a girlfriend who lives with him.      Review of Systems Pertinent positives and negatives in the history of present illness.     Objective:   Physical Exam BP 130/78   Pulse 83   Temp (!) 96.9 F (36.1 C)   Wt 192 lb (87.1 kg)   SpO2 99%   BMI 29.19 kg/m   Alert and oriented and in no acute distress.  Not otherwise examined      Assessment & Plan:  Essential hypertension -Here today for 4-week follow-up on hypertension.  Prior to 4 weeks ago he had been off of his blood pressure medication for a couple of years.  Blood pressure controlled now on lisinopril/HCTZ.  He just bought a blood pressure cuff yesterday so he will start checking his blood  pressure at home and let me know if he is having any elevated readings.  Recommend low-sodium diet.  Follow-up in 3 months  Anemia, unspecified type - Plan: CBC with Differential/Platelet -Unclear etiology.  Discussed that he may start taking an OTC oral iron supplement.  He is seeing GI tomorrow and is overdue for his colonoscopy.  Patient reports history of hemorrhoids and does see bright red blood regularly.  He did not report this at his previous visit with me.  Snoring - Plan: Home sleep test -He needs a home sleep study to determine if he has OSA.  Witnessed apneic spells - Plan: Home sleep test -His girlfriend has witnessed him stopping breathing regularly.  I will order a home sleep study to see if he has OSA.  At risk for sleep apnea - Plan: Home sleep test -Suspicious that he does have OSA and will need treated for this.  We discussed potential long-term health consequences associated with untreated sleep apnea.  Elevated serum creatinine - Plan: Comprehensive metabolic panel -Recommend good blood pressure control and that he increase his water intake and cut back on sodas and sweet tea.  Follow-up pending results  He may let me know if he would like to pursue physical therapy  for his chronic back pain

## 2019-10-04 NOTE — Patient Instructions (Signed)
Your blood pressure has improved so continue on your current medication. Start checking your blood pressure at home sporadically throughout the week and let me know if your readings are consistently above 130/80.  I recommend you drink more water and less soda and sweet tea.  You will receive a call from [facility] regarding a home sleep study for sleep apnea.  If you decide you would like to try physical therapy for your back pain, let me know  Start taking a men's over 50 One-A-Day vitamin.  You may want to start taking an iron supplement as well. Let us see what the gastroenterologist think she should do at your appointment tomorrow.  Follow-up in 3 months

## 2019-10-05 ENCOUNTER — Encounter: Payer: Self-pay | Admitting: Gastroenterology

## 2019-10-05 ENCOUNTER — Ambulatory Visit: Payer: 59 | Admitting: Gastroenterology

## 2019-10-05 VITALS — BP 124/70 | HR 87 | Temp 98.7°F | Ht 70.0 in | Wt 190.0 lb

## 2019-10-05 DIAGNOSIS — R12 Heartburn: Secondary | ICD-10-CM

## 2019-10-05 DIAGNOSIS — D500 Iron deficiency anemia secondary to blood loss (chronic): Secondary | ICD-10-CM

## 2019-10-05 DIAGNOSIS — K625 Hemorrhage of anus and rectum: Secondary | ICD-10-CM | POA: Diagnosis not present

## 2019-10-05 DIAGNOSIS — Z01818 Encounter for other preprocedural examination: Secondary | ICD-10-CM | POA: Diagnosis not present

## 2019-10-05 LAB — CBC WITH DIFFERENTIAL/PLATELET
Basophils Absolute: 0.1 10*3/uL (ref 0.0–0.2)
Basos: 1 %
EOS (ABSOLUTE): 0.2 10*3/uL (ref 0.0–0.4)
Eos: 4 %
Hematocrit: 37.7 % (ref 37.5–51.0)
Hemoglobin: 11.3 g/dL — ABNORMAL LOW (ref 13.0–17.7)
Immature Grans (Abs): 0 10*3/uL (ref 0.0–0.1)
Immature Granulocytes: 0 %
Lymphocytes Absolute: 1.5 10*3/uL (ref 0.7–3.1)
Lymphs: 27 %
MCH: 23.1 pg — ABNORMAL LOW (ref 26.6–33.0)
MCHC: 30 g/dL — ABNORMAL LOW (ref 31.5–35.7)
MCV: 77 fL — ABNORMAL LOW (ref 79–97)
Monocytes Absolute: 0.6 10*3/uL (ref 0.1–0.9)
Monocytes: 10 %
Neutrophils Absolute: 3.2 10*3/uL (ref 1.4–7.0)
Neutrophils: 58 %
Platelets: 260 10*3/uL (ref 150–450)
RBC: 4.89 x10E6/uL (ref 4.14–5.80)
RDW: 17 % — ABNORMAL HIGH (ref 11.6–15.4)
WBC: 5.6 10*3/uL (ref 3.4–10.8)

## 2019-10-05 LAB — COMPREHENSIVE METABOLIC PANEL
ALT: 26 IU/L (ref 0–44)
AST: 32 IU/L (ref 0–40)
Albumin/Globulin Ratio: 1.1 — ABNORMAL LOW (ref 1.2–2.2)
Albumin: 4.4 g/dL (ref 3.8–4.9)
Alkaline Phosphatase: 92 IU/L (ref 39–117)
BUN/Creatinine Ratio: 24 — ABNORMAL HIGH (ref 9–20)
BUN: 29 mg/dL — ABNORMAL HIGH (ref 6–24)
Bilirubin Total: 0.3 mg/dL (ref 0.0–1.2)
CO2: 20 mmol/L (ref 20–29)
Calcium: 9.9 mg/dL (ref 8.7–10.2)
Chloride: 104 mmol/L (ref 96–106)
Creatinine, Ser: 1.22 mg/dL (ref 0.76–1.27)
GFR calc Af Amer: 75 mL/min/{1.73_m2} (ref >59)
GFR calc non Af Amer: 64 mL/min/{1.73_m2} (ref >59)
Globulin, Total: 4 g/dL (ref 1.5–4.5)
Glucose: 95 mg/dL (ref 65–99)
Potassium: 4.5 mmol/L (ref 3.5–5.2)
Sodium: 138 mmol/L (ref 134–144)
Total Protein: 8.4 g/dL (ref 6.0–8.5)

## 2019-10-05 MED ORDER — NA SULFATE-K SULFATE-MG SULF 17.5-3.13-1.6 GM/177ML PO SOLN: 1.0000 | Freq: Once | ORAL | 0 refills | Status: AC

## 2019-10-05 NOTE — Progress Notes (Signed)
His hemoglobin is holding steady but still significant anemia. He is seeing GI later today from what I remember.

## 2019-10-05 NOTE — Progress Notes (Signed)
[facility] Consult Note:  History: Jackson Donaldson 10/05/2019  Referring provider: Girtha Rm, NP-C  Reason for consult/chief complaint: Anemia (patient said he has rectal bleeding from a hemorrhoids)   Subjective  HPI:  This is a very pleasant 59 year old man referred by primary care after he recently established care with them, and routine blood work found iron deficiency anemia. He has had chronic nosebleeds, perhaps several times a week (see below) For at least several years he has had intermittent painless rectal bleeding for a day or 2 just several times a year, which he has attributed to hemorrhoids.  Bowel habits typically twice a day, sometimes more or less frequent depending upon his diet.  Spicy food tends to cause more loose stool and might precipitate an episode of bleeding.  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 He denies dysphagia, odynophagia nausea or vomiting, but has intermittent heartburn that he treats with as needed Pepcid.  Colonoscopy in February 2003 by Dr. Velora Heckler noted external hemorrhoids. ROS:  Review of Systems  Constitutional: Negative for appetite change and unexpected weight change.  HENT: Negative for mouth sores and voice change.   Eyes: Negative for pain and redness.  Respiratory: Negative for cough and shortness of breath.   Cardiovascular: Negative for chest pain and palpitations.  Genitourinary: Negative for dysuria and hematuria.  Musculoskeletal: Negative for arthralgias and myalgias.  Skin: Negative for pallor and rash.  Neurological: Negative for weakness and headaches.  Hematological: Negative for adenopathy.     Frequent nosebleeds, says he has a deviated septum and recently saw ENT.  Surgery is needed to fix it. Past Medical History: Past Medical History:  Diagnosis Date  . Hypertension   . Kidney stones      Past Surgical History: Past Surgical History:    Procedure Laterality Date  . no past surgeries       Family History: Family History  Problem Relation Age of Onset  . Diabetes Mother   . Breast cancer Sister   . Lung cancer Sister   . Colon cancer Neg Hx   . Esophageal cancer Neg Hx   . Rectal cancer Neg Hx     Social History: Social History   Socioeconomic History  . Marital status: Divorced    Spouse name: Not on file  . Number of children: 2  . Years of education: Not on file  . Highest education level: Not on file  Occupational History  . Occupation: Auto-tech-owner  Tobacco Use  . Smoking status: Never Smoker  . Smokeless tobacco: Never Used  Substance and Sexual Activity  . Alcohol use: Yes    Comment: social  . Drug use: No  . Sexual activity: Not on file  Other Topics Concern  . Not on file  Social History Narrative  . Not on file   Social Determinants of Health   Financial Resource Strain:   . Difficulty of Paying Living Expenses:   Food Insecurity:   . Worried About Charity fundraiser in the Last Year:   . Arboriculturist in the Last Year:   Transportation Needs:   . Film/video editor (Medical):   Marland Kitchen Lack of Transportation (Non-Medical):   Physical Activity:   . Days of Exercise per Week:   . Minutes of Exercise per Session:   Stress:   . Feeling of Stress :   Social Connections:   . Frequency of Communication with Friends  and Family:   . Frequency of Social Gatherings with Friends and Family:   . Attends Religious Services:   . Active Member of Clubs or Organizations:   . Attends Banker Meetings:   Marland Kitchen Marital Status:     Allergies: No Known Allergies  Outpatient Meds: Current Outpatient Medications  Medication Sig Dispense Refill  . lisinopril-hydrochlorothiazide (ZESTORETIC) 10-12.5 MG tablet Take 1 tablet by mouth daily. 30 tablet 1  . methocarbamol (ROBAXIN) 500 MG tablet Take 1 tablet (500 mg total) by mouth 4 (four) times daily. 30 tablet 1   No current  facility-administered medications for this visit.      ___________________________________________________________________ Objective   Exam:  BP 124/70   Pulse 87   Temp 98.7 F (37.1 C)   Ht 5\' 10"  (1.778 m)   Wt 190 lb (86.2 kg)   BMI 27.26 kg/m    General: Well-appearing, normal vocal quality  Eyes: sclera anicteric, no redness  ENT: oral mucosa moist without lesions, no cervical or supraclavicular lymphadenopathy  CV: RRR without murmur, S1/S2, no JVD, no peripheral edema  Resp: clear to auscultation bilaterally, normal RR and effort noted  GI: soft, no tenderness, with active bowel sounds. No guarding or palpable organomegaly noted.  Skin; warm and dry, no rash or jaundice noted  Neuro: awake, alert and oriented x 3. Normal gross motor function and fluent speech Rectal exam deferred Labs:  CBC Latest Ref Rng & Units 10/04/2019 09/06/2019 01/13/2014  WBC 3.4 - 10.8 x10E3/uL 5.6 6.6 5.0  Hemoglobin 13.0 - 17.7 g/dL 11.3(L) 11.4(L) 16.0  Hematocrit 37.5 - 51.0 % 37.7 36.7(L) 46.2  Platelets 150 - 450 x10E3/uL 260 305 197  MCV 77  CMP Latest Ref Rng & Units 10/04/2019 09/06/2019 01/13/2014  Glucose 65 - 99 mg/dL 95 91 90  BUN 6 - 24 mg/dL 01/15/2014) 24 17  Creatinine 0.76 - 1.27 mg/dL 93(Z 1.69) 6.78(L  Sodium 134 - 144 mmol/L 138 135 140  Potassium 3.5 - 5.2 mmol/L 4.5 5.0 4.0  Chloride 96 - 106 mmol/L 104 101 100  CO2 20 - 29 mmol/L 20 21 22   Calcium 8.7 - 10.2 mg/dL 9.9 9.3 9.8  Total Protein 6.0 - 8.5 g/dL 8.4 8.3 9.0(H)  Total Bilirubin 0.0 - 1.2 mg/dL 0.3 0.4 0.7  Alkaline Phos 39 - 117 IU/L 92 104 108  AST 0 - 40 IU/L 32 28 21  ALT 0 - 44 IU/L 26 21 18     Iron/TIBC/Ferritin/ %Sat    Component Value Date/Time   IRON 45 09/06/2019 1621   TIBC 456 (H) 09/06/2019 1621   FERRITIN 14 (L) 09/06/2019 1621   IRONPCTSAT 10 (L) 09/06/2019 1621   Assessment: Encounter Diagnoses  Name Primary?  . Iron deficiency anemia due to chronic blood loss Yes  . Rectal  bleeding   . Heartburn     Years of chronic stable heartburn, no dysphagia. Iron deficiency anemia, perhaps due to chronic epistaxis but must rule out source of GI blood loss.  The rectal bleeding describes is most likely hemorrhoidal, but additional GI sources should be considered.  It has been many years since his last colonoscopy.  Plan:  Once daily "blood builder" iron/folate/B12 supplement with vitamin C. EGD and colonoscopy.  He is agreeable after discussion of procedure and risks.  The benefits and risks of the planned procedure were described in detail with the patient or (when appropriate) their health care proxy.  Risks were outlined as including, but not limited to,  bleeding, infection, perforation, adverse medication reaction leading to cardiac or pulmonary decompensation, pancreatitis (if ERCP).  The limitation of incomplete mucosal visualization was also discussed.  No guarantees or warranties were given.   Thank you for the courtesy of this consult.  Please call me with any questions or concerns.  Charlie Pitter III  CC: Referring provider noted above

## 2019-10-05 NOTE — Patient Instructions (Addendum)
  If you are age 60 or older, your body mass index should be between 23-30. Your Body mass index is 27.26 kg/m. If this is out of the aforementioned range listed, please consider follow up with your Primary Care Provider.  If you are age 99 or younger, your body mass index should be between 19-25. Your Body mass index is 27.26 kg/m. If this is out of the aformentioned range listed, please consider follow up with your Primary Care Provider.   Please purchase a bottle of Blood Builder over the counter (available on line)  Do not take it 5 days before the procedure!  You have been scheduled for an endoscopy and colonoscopy. Please follow the written instructions given to you at your visit today. Please pick up your prep supplies at the pharmacy within the next 1-3 days. If you use inhalers (even only as needed), please bring them with you on the day of your procedure. Your physician has requested that you go to www.startemmi.com and enter the access code given to you at your visit today. This web site gives a general overview about your procedure. However, you should still follow specific instructions given to you by our office regarding your preparation for the procedure.  It was a pleasure to see you today!  Dr. Myrtie Neither

## 2019-10-26 HISTORY — PX: COLONOSCOPY: SHX174

## 2019-11-06 ENCOUNTER — Other Ambulatory Visit: Payer: Self-pay

## 2019-11-06 ENCOUNTER — Other Ambulatory Visit: Payer: Self-pay | Admitting: Gastroenterology

## 2019-11-06 ENCOUNTER — Ambulatory Visit (INDEPENDENT_AMBULATORY_CARE_PROVIDER_SITE_OTHER): Payer: 59

## 2019-11-06 DIAGNOSIS — Z1159 Encounter for screening for other viral diseases: Secondary | ICD-10-CM

## 2019-11-07 LAB — SARS CORONAVIRUS 2 (TAT 6-24 HRS): SARS Coronavirus 2: NEGATIVE

## 2019-11-08 ENCOUNTER — Encounter: Payer: Self-pay | Admitting: Gastroenterology

## 2019-11-08 ENCOUNTER — Other Ambulatory Visit: Payer: Self-pay

## 2019-11-08 ENCOUNTER — Ambulatory Visit (AMBULATORY_SURGERY_CENTER): Payer: 59 | Admitting: Gastroenterology

## 2019-11-08 VITALS — BP 99/83 | HR 52 | Temp 95.5°F | Resp 17 | Ht 70.0 in | Wt 190.0 lb

## 2019-11-08 DIAGNOSIS — K2100 Gastro-esophageal reflux disease with esophagitis, without bleeding: Secondary | ICD-10-CM

## 2019-11-08 DIAGNOSIS — K449 Diaphragmatic hernia without obstruction or gangrene: Secondary | ICD-10-CM

## 2019-11-08 DIAGNOSIS — K2950 Unspecified chronic gastritis without bleeding: Secondary | ICD-10-CM

## 2019-11-08 DIAGNOSIS — K625 Hemorrhage of anus and rectum: Secondary | ICD-10-CM | POA: Diagnosis not present

## 2019-11-08 DIAGNOSIS — D500 Iron deficiency anemia secondary to blood loss (chronic): Secondary | ICD-10-CM

## 2019-11-08 DIAGNOSIS — K2960 Other gastritis without bleeding: Secondary | ICD-10-CM

## 2019-11-08 MED ORDER — SODIUM CHLORIDE 0.9 % IV SOLN: 500.0000 mL | Freq: Once | INTRAVENOUS | Status: DC

## 2019-11-08 NOTE — Progress Notes (Signed)
pt tolerated well. VSS. awake and to recovery. Report given to RN.  

## 2019-11-08 NOTE — Patient Instructions (Signed)
Thank you for allowing [place] to care for you today!  Await pathology results, approximately 2 weeks.  Recommendations will be made at that time.  Recommend next screening colonoscopy in 10 years.  Resume previous diet today.  Return to your normal activities tomorrow.  Continue current medications including iron supplement.  Recommend taking  over-the-counter Prilosec ( Omeprazole) 20 mg by mouth daily for 4 weeks.  Minimize use of Aspirin, Ibuprofen, or other NSAIDS.  See Primary care MD in about 6 weeks to recheck blood count and iron levels.  YOU HAD AN ENDOSCOPIC PROCEDURE TODAY AT THE [facility]:   Refer to the procedure report that was given to you for any specific questions about what was found during the examination.  If the procedure report does not answer your questions, please call your gastroenterologist to clarify.  If you requested that your care partner not be given the details of your procedure findings, then the procedure report has been included in a sealed envelope for you to review at your convenience later.  YOU SHOULD EXPECT: Some feelings of bloating in the abdomen. Passage of more gas than usual.  Walking can help get rid of the air that was put into your GI tract during the procedure and reduce the bloating. If you had a lower endoscopy (such as a colonoscopy or flexible sigmoidoscopy) you may notice spotting of blood in your stool or on the toilet paper. If you underwent a bowel prep for your procedure, you may not have a normal bowel movement for a few days.  Please Note:  You might notice some irritation and congestion in your nose or some drainage.  This is from the oxygen used during your procedure.  There is no need for concern and it should clear up in a day or so.  SYMPTOMS TO REPORT IMMEDIATELY:   Following lower endoscopy (colonoscopy or flexible sigmoidoscopy):  Excessive amounts of blood in the stool  Significant tenderness or worsening of  abdominal pains  Swelling of the abdomen that is new, acute  Fever of 100F or higher   Following upper endoscopy (EGD)  Vomiting of blood or coffee ground material  New chest pain or pain under the shoulder blades  Painful or persistently difficult swallowing  New shortness of breath  Fever of 100F or higher  Black, tarry-looking stools  For urgent or emergent issues, a gastroenterologist can be reached at any hour by calling [phone]. Do not use MyChart messaging for urgent concerns.    DIET:  We do recommend a small meal at first, but then you may proceed to your regular diet.  Drink plenty of fluids but you should avoid alcoholic beverages for 24 hours.  ACTIVITY:  You should plan to take it easy for the rest of today and you should NOT DRIVE or use heavy machinery until tomorrow (because of the sedation medicines used during the test).    FOLLOW UP: Our staff will call the number listed on your records 48-72 hours following your procedure to check on you and address any questions or concerns that you may have regarding the information given to you following your procedure. If we do not reach you, we will leave a message.  We will attempt to reach you two times.  During this call, we will ask if you have developed any symptoms of COVID 19. If you develop any symptoms (ie: fever, flu-like symptoms, shortness of breath, cough etc.) before then, please call [phone].  If  you test positive for Covid 19 in the 2 weeks post procedure, please call and report this information to [place].    If any biopsies were taken you will be contacted by phone or by letter within the next 1-3 weeks.  Please call us at [phone] if you have not heard about the biopsies in 3 weeks.    SIGNATURES/CONFIDENTIALITY: You and/or your care partner have signed paperwork which will be entered into your electronic medical record.  These signatures attest to the fact that that the information above on your  After Visit Summary has been reviewed and is understood.  Full responsibility of the confidentiality of this discharge information lies with you and/or your care-partner.YOU HAD AN ENDOSCOPIC PROCEDURE TODAY AT THE [facility]:   Refer to the procedure report that was given to you for any specific questions about what was found during the examination.  If the procedure report does not answer your questions, please call your gastroenterologist to clarify.  If you requested that your care partner not be given the details of your procedure findings, then the procedure report has been included in a sealed envelope for you to review at your convenience later.  YOU SHOULD EXPECT: Some feelings of bloating in the abdomen. Passage of more gas than usual.  Walking can help get rid of the air that was put into your GI tract during the procedure and reduce the bloating. If you had a lower endoscopy (such as a colonoscopy or flexible sigmoidoscopy) you may notice spotting of blood in your stool or on the toilet paper. If you underwent a bowel prep for your procedure, you may not have a normal bowel movement for a few days.  Please Note:  You might notice some irritation and congestion in your nose or some drainage.  This is from the oxygen used during your procedure.  There is no need for concern and it should clear up in a day or so.  SYMPTOMS TO REPORT IMMEDIATELY:   Following lower endoscopy (colonoscopy or flexible sigmoidoscopy):  Excessive amounts of blood in the stool  Significant tenderness or worsening of abdominal pains  Swelling of the abdomen that is new, acute  Fever of 100F or higher   Following upper endoscopy (EGD)  Vomiting of blood or coffee ground material  New chest pain or pain under the shoulder blades  Painful or persistently difficult swallowing  New shortness of breath  Fever of 100F or higher  Black, tarry-looking stools  For urgent or emergent issues, a  gastroenterologist can be reached at any hour by calling [phone]. Do not use MyChart messaging for urgent concerns.    DIET:  We do recommend a small meal at first, but then you may proceed to your regular diet.  Drink plenty of fluids but you should avoid alcoholic beverages for 24 hours.  ACTIVITY:  You should plan to take it easy for the rest of today and you should NOT DRIVE or use heavy machinery until tomorrow (because of the sedation medicines used during the test).    FOLLOW UP: Our staff will call the number listed on your records 48-72 hours following your procedure to check on you and address any questions or concerns that you may have regarding the information given to you following your procedure. If we do not reach you, we will leave a message.  We will attempt to reach you two times.  During this call, we will ask if you have developed  any symptoms of COVID 19. If you develop any symptoms (ie: fever, flu-like symptoms, shortness of breath, cough etc.) before then, please call [phone].  If you test positive for Covid 19 in the 2 weeks post procedure, please call and report this information to [place].    If any biopsies were taken you will be contacted by phone or by letter within the next 1-3 weeks.  Please call us at [phone] if you have not heard about the biopsies in 3 weeks.    SIGNATURES/CONFIDENTIALITY: You and/or your care partner have signed paperwork which will be entered into your electronic medical record.  These signatures attest to the fact that that the information above on your After Visit Summary has been reviewed and is understood.  Full responsibility of the confidentiality of this discharge information lies with you and/or your care-partner.    YOU HAD AN ENDOSCOPIC PROCEDURE TODAY AT THE [facility]:   Refer to the procedure report that was given to you for any specific questions about what was found during the examination.  If the  procedure report does not answer your questions, please call your gastroenterologist to clarify.  If you requested that your care partner not be given the details of your procedure findings, then the procedure report has been included in a sealed envelope for you to review at your convenience later.  YOU SHOULD EXPECT: Some feelings of bloating in the abdomen. Passage of more gas than usual.  Walking can help get rid of the air that was put into your GI tract during the procedure and reduce the bloating. If you had a lower endoscopy (such as a colonoscopy or flexible sigmoidoscopy) you may notice spotting of blood in your stool or on the toilet paper. If you underwent a bowel prep for your procedure, you may not have a normal bowel movement for a few days.  Please Note:  You might notice some irritation and congestion in your nose or some drainage.  This is from the oxygen used during your procedure.  There is no need for concern and it should clear up in a day or so.  SYMPTOMS TO REPORT IMMEDIATELY:   Following lower endoscopy (colonoscopy or flexible sigmoidoscopy):  Excessive amounts of blood in the stool  Significant tenderness or worsening of abdominal pains  Swelling of the abdomen that is new, acute  Fever of 100F or higher   Following upper endoscopy (EGD)  Vomiting of blood or coffee ground material  New chest pain or pain under the shoulder blades  Painful or persistently difficult swallowing  New shortness of breath  Fever of 100F or higher  Black, tarry-looking stools  For urgent or emergent issues, a gastroenterologist can be reached at any hour by calling [phone]. Do not use MyChart messaging for urgent concerns.    DIET:  We do recommend a small meal at first, but then you may proceed to your regular diet.  Drink plenty of fluids but you should avoid alcoholic beverages for 24 hours.  ACTIVITY:  You should plan to take it easy for the rest of today and you should  NOT DRIVE or use heavy machinery until tomorrow (because of the sedation medicines used during the test).    FOLLOW UP: Our staff will call the number listed on your records 48-72 hours following your procedure to check on you and address any questions or concerns that you may have regarding the information given to you following your procedure. If  we do not reach you, we will leave a message.  We will attempt to reach you two times.  During this call, we will ask if you have developed any symptoms of COVID 19. If you develop any symptoms (ie: fever, flu-like symptoms, shortness of breath, cough etc.) before then, please call [phone].  If you test positive for Covid 19 in the 2 weeks post procedure, please call and report this information to [place].    If any biopsies were taken you will be contacted by phone or by letter within the next 1-3 weeks.  Please call us at [phone] if you have not heard about the biopsies in 3 weeks.    SIGNATURES/CONFIDENTIALITY: You and/or your care partner have signed paperwork which will be entered into your electronic medical record.  These signatures attest to the fact that that the information above on your After Visit Summary has been reviewed and is understood.  Full responsibility of the confidentiality of this discharge information lies with you and/or your care-partner.

## 2019-11-08 NOTE — Progress Notes (Signed)
Called to room to assist during endoscopic procedure.  Patient ID and intended procedure confirmed with present staff. Received instructions for my participation in the procedure from the performing physician.  

## 2019-11-08 NOTE — Op Note (Signed)
[facility] Patient Name: Jackson Donaldson Procedure Date: 11/08/2019 10:35 AM MRN: [id number] Endoscopist: Mallie Mussel L. Loletha Carrow , MD Age: 59 Referring MD:  Date of Birth: [birth year] Gender: Male Account #: [id number] Procedure:                Upper GI endoscopy Indications:              Iron deficiency anemia secondary to chronic blood                            loss (Hgb 11.4 - see colonoscopy report for add'l                            history and findings) Medicines:                Monitored Anesthesia Care Procedure:                Pre-Anesthesia Assessment:                           - Prior to the procedure, a History and Physical                            was performed, and patient medications and                            allergies were reviewed. The patient's tolerance of                            previous anesthesia was also reviewed. The risks                            and benefits of the procedure and the sedation                            options and risks were discussed with the patient.                            All questions were answered, and informed consent                            was obtained. Prior Anticoagulants: The patient has                            taken no previous anticoagulant or antiplatelet                            agents. ASA Grade Assessment: II - A patient with                            mild systemic disease. After reviewing the risks                            and benefits, the patient was deemed in  satisfactory condition to undergo the procedure.                           After obtaining informed consent, the endoscope was                            passed under direct vision. Throughout the                            procedure, the patient's blood pressure, pulse, and                            oxygen saturations were monitored continuously. The                            Endoscope was introduced  through the mouth, and                            advanced to the second part of duodenum. The upper                            GI endoscopy was accomplished without difficulty.                            The patient tolerated the procedure well. Scope In: Scope Out: Findings:                 LA Grade A (one or more mucosal breaks less than 5                            mm, not extending between tops of 2 mucosal folds)                            esophagitis was found at the gastroesophageal                            junction.                           Multiple dispersed small erosions with no stigmata                            of recent bleeding were found in the gastric                            fundus, in the gastric body and in the prepyloric                            region of the stomach. Biopsies were taken with a                            cold forceps for histology. ([place] protocol).  A 5 cm hiatal hernia was present (35-40cm) with a                            small para-esophageal component.                           The exam of the stomach was otherwise normal,                            including on retroflexion.                           The examined duodenum was normal. Complications:            No immediate complications. Estimated Blood Loss:     Estimated blood loss was minimal. Impression:               - LA Grade A reflux esophagitis.                           - Erosive gastropathy with no stigmata of recent                            bleeding. Biopsied.                           - 5 cm hiatal hernia.                           - Normal examined duodenum.                           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 Recommendation:           - Patient has a contact number available for                            emergencies. The signs and symptoms of potential                             delayed complications were discussed with the                            patient. Return to normal activities tomorrow.                            Written discharge instructions were provided to the                            patient.                           - Resume previous diet.                           -  Continue present medications, including iron                            supplement.                           - Use over-the-counter Prilosec (omeprazole) 20 mg                            PO daily for 4 weeks.                           - Await pathology results.                           - See the other procedure note for documentation of                            additional recommendations.                           - If taking aspirin or NSAIDs, minimize use of                            those medicines.                           - See primary care in about 6 weeks to recheck                            blood count and iron levels. Mukhtar Shams L. Myrtie Neither, MD 11/08/2019 11:52:19 AM This report has been signed electronically.

## 2019-11-08 NOTE — Op Note (Signed)
[facility] Patient Name: Jackson Donaldson Procedure Date: 11/08/2019 10:36 AM MRN: [id number] Endoscopist: Sherilyn Cooter L. Myrtie Neither , MD Age: 59 Referring MD:  Date of Birth: 12-Oct-1960 Gender: Male Account #: [id number] Procedure:                Colonoscopy Indications:              Rectal bleeding( Infrequently, for many years),                            Iron deficiency anemia secondary to chronic blood                            loss (also has frequent epistaxis) Medicines:                Monitored Anesthesia Care Procedure:                Pre-Anesthesia Assessment:                           - Prior to the procedure, a History and Physical                            was performed, and patient medications and                            allergies were reviewed. The patient's tolerance of                            previous anesthesia was also reviewed. The risks                            and benefits of the procedure and the sedation                            options and risks were discussed with the patient.                            All questions were answered, and informed consent                            was obtained. Prior Anticoagulants: The patient has                            taken no previous anticoagulant or antiplatelet                            agents. ASA Grade Assessment: II - A patient with                            mild systemic disease. After reviewing the risks                            and benefits, the patient was deemed in  satisfactory condition to undergo the procedure.                           After obtaining informed consent, the colonoscope                            was passed under direct vision. Throughout the                            procedure, the patient's blood pressure, pulse, and                            oxygen saturations were monitored continuously. The                            Colonoscope was  introduced through the anus and                            advanced to the the cecum, identified by                            appendiceal orifice and ileocecal valve. The                            colonoscopy was performed without difficulty. The                            patient tolerated the procedure well. The quality                            of the bowel preparation was excellent. The                            ileocecal valve, appendiceal orifice, and rectum                            were photographed. Scope In: 11:16:06 AM Scope Out: 11:26:09 AM Scope Withdrawal Time: 0 hours 8 minutes 52 seconds  Total Procedure Duration: 0 hours 10 minutes 3 seconds  Findings:                 The perianal and digital rectal examinations were                            normal.                           The entire examined colon appeared normal on direct                            and retroflexion views. Complications:            No immediate complications. Estimated Blood Loss:     Estimated blood loss: none. Impression:               - The entire examined colon is normal on direct and  retroflexion views.                           - No specimens collected.                           Benign anal bleeding. Recommendation:           - Patient has a contact number available for                            emergencies. The signs and symptoms of potential                            delayed complications were discussed with the                            patient. Return to normal activities tomorrow.                            Written discharge instructions were provided to the                            patient.                           - Resume previous diet.                           - Continue present medications.                           - Repeat colonoscopy in 10 years for screening                            purposes.                           - See the other  procedure note for documentation of                            additional recommendations. Adraine Biffle L. Myrtie Neither, MD 11/08/2019 11:45:01 AM This report has been signed electronically.

## 2019-11-10 ENCOUNTER — Telehealth: Payer: Self-pay

## 2019-11-10 NOTE — Telephone Encounter (Signed)
  Follow up Call-  Call back number 11/08/2019  Post procedure Call Back phone  # [phone]  Permission to leave phone message Yes  Some recent data might be hidden     Patient questions:  Do you have a fever, pain , or abdominal swelling? No. Pain Score  0 *  Have you tolerated food without any problems? Yes.    Have you been able to return to your normal activities? Yes.    Do you have any questions about your discharge instructions: Diet   No. Medications  No. Follow up visit  No.  Do you have questions or concerns about your Care? No.  Actions: * If pain score is 4 or above: No action needed, pain <4.  1. Have you developed a fever since your procedure? No  2.   Have you had an respiratory symptoms (SOB or cough) since your procedure? No 3.   Have you tested positive for COVID 19 since your procedure No  4.   Have you had any family members/close contacts diagnosed with the COVID 19 since your procedure? No  If yes to any of these questions please route to Laverna Peace, RN and Charlett Lango, RN

## 2019-11-13 ENCOUNTER — Other Ambulatory Visit: Payer: Self-pay | Admitting: Family Medicine

## 2019-11-13 ENCOUNTER — Encounter: Payer: Self-pay | Admitting: Gastroenterology

## 2019-11-13 DIAGNOSIS — I10 Essential (primary) hypertension: Secondary | ICD-10-CM

## 2019-11-29 ENCOUNTER — Other Ambulatory Visit: Payer: Self-pay

## 2019-11-29 ENCOUNTER — Telehealth: Payer: 59 | Admitting: Family Medicine

## 2019-11-29 ENCOUNTER — Other Ambulatory Visit: Payer: Self-pay | Admitting: Family Medicine

## 2019-11-29 ENCOUNTER — Encounter: Payer: Self-pay | Admitting: Family Medicine

## 2019-11-29 ENCOUNTER — Telehealth: Payer: Self-pay | Admitting: Internal Medicine

## 2019-11-29 VITALS — Temp 97.0°F | Wt 190.0 lb

## 2019-11-29 DIAGNOSIS — R058 Other specified cough: Secondary | ICD-10-CM

## 2019-11-29 DIAGNOSIS — R05 Cough: Secondary | ICD-10-CM | POA: Diagnosis not present

## 2019-11-29 DIAGNOSIS — J329 Chronic sinusitis, unspecified: Secondary | ICD-10-CM

## 2019-11-29 DIAGNOSIS — G8929 Other chronic pain: Secondary | ICD-10-CM

## 2019-11-29 DIAGNOSIS — J40 Bronchitis, not specified as acute or chronic: Secondary | ICD-10-CM | POA: Diagnosis not present

## 2019-11-29 DIAGNOSIS — M5440 Lumbago with sciatica, unspecified side: Secondary | ICD-10-CM

## 2019-11-29 DIAGNOSIS — I10 Essential (primary) hypertension: Secondary | ICD-10-CM | POA: Diagnosis not present

## 2019-11-29 MED ORDER — LISINOPRIL-HYDROCHLOROTHIAZIDE 10-12.5 MG PO TABS: 1.0000 | ORAL_TABLET | Freq: Every day | ORAL | 2 refills | Status: DC

## 2019-11-29 MED ORDER — HYDROCODONE-HOMATROPINE 5-1.5 MG/5ML PO SYRP: 5.0000 mL | ORAL_SOLUTION | Freq: Every evening | ORAL | 0 refills | Status: DC | PRN

## 2019-11-29 MED ORDER — AZITHROMYCIN 250 MG PO TABS: ORAL_TABLET | ORAL | 0 refills | Status: DC

## 2019-11-29 NOTE — Telephone Encounter (Signed)
Is this okay to refill? 

## 2019-11-29 NOTE — Addendum Note (Signed)
Addended by: Herminio Commons A on: 11/29/2019 09:30 AM   Modules accepted: Orders

## 2019-11-29 NOTE — Progress Notes (Signed)
   Subjective:  Documentation for virtual audio and video telecommunications through Caregility encounter:  The patient was located at home. 2 patient identifiers used.  The provider was located in the office. The patient did consent to this visit and is aware of possible charges through their insurance for this visit.  The other persons participating in this telemedicine service were none. Time spent on call was 15 minutes and in review of previous records 15 minutes total.  This virtual service is not related to other E/M service within previous 7 days.   Patient ID: Jackson Donaldson, male    DOB: [birth year], 60 y.o.   MRN: [id number]  HPI Chief Complaint  Patient presents with  . possible bronchitis    bronchitis- day 3-4. symptoms often at night, coughing mucous no fever, gets this every year,  had covid test- due to having colonoscopy   Complains of a 3 day history of coughing. Coughing up yellow mucus.  Cough is generally worse at night but becoming more frequent during the day as well. He is taking Mucinex.   States he is sleeping in a recliner to help with his cough.   Dr Myrtie Neither had him take Prilosec for 30 days.  He is taking this currently.  Denies fever, chills, dizziness, chest pain, palpitations, shortness of breath, abdominal pain, nausea, vomiting, diarrhea.  Having surgery 01/30/2020 for his sinuses.    Review of Systems Pertinent positives and negatives in the history of present illness.     Objective:   Physical Exam Temp (!) 97 F (36.1 C)   Wt 190 lb (86.2 kg)   BMI 27.26 kg/m   Alert and oriented in no acute distress.  Respirations unlabored.  Speaking in complete sentences without difficulty.      Assessment & Plan:  Chronic sinusitis with recurrent bronchitis - Plan: azithromycin (ZITHROMAX Z-PAK) 250 MG tablet, HYDROcodone-homatropine (HYCODAN) 5-1.5 MG/5ML syrup  Productive cough - Plan: HYDROcodone-homatropine (HYCODAN) 5-1.5 MG/5ML  syrup  He has a history of recurrent sinusitis.  Upcoming sinus surgery.  He also reports developing bronchitis every year and these are the same symptoms. I will treat him with a Z-pak and Hycodan. This has worked well for him in the past. Discussed hydration and Tylenol if needed. Consider getting a Covid test especially if any new or worsening symptoms arise.

## 2019-11-29 NOTE — Telephone Encounter (Signed)
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 He states if he had more symptoms then he would consider but states he does not have covid

## 2020-01-01 ENCOUNTER — Other Ambulatory Visit: Payer: Self-pay

## 2020-01-01 ENCOUNTER — Ambulatory Visit (HOSPITAL_BASED_OUTPATIENT_CLINIC_OR_DEPARTMENT_OTHER): Payer: 59 | Attending: Family Medicine | Admitting: Internal Medicine

## 2020-01-01 VITALS — Ht 70.0 in | Wt 192.0 lb

## 2020-01-01 DIAGNOSIS — G4733 Obstructive sleep apnea (adult) (pediatric): Secondary | ICD-10-CM | POA: Diagnosis not present

## 2020-01-01 DIAGNOSIS — R0681 Apnea, not elsewhere classified: Secondary | ICD-10-CM

## 2020-01-01 DIAGNOSIS — R0683 Snoring: Secondary | ICD-10-CM

## 2020-01-01 DIAGNOSIS — Z9189 Other specified personal risk factors, not elsewhere classified: Secondary | ICD-10-CM

## 2020-01-03 ENCOUNTER — Other Ambulatory Visit: Payer: Self-pay

## 2020-01-03 ENCOUNTER — Ambulatory Visit: Payer: 59 | Admitting: Family Medicine

## 2020-01-03 ENCOUNTER — Encounter: Payer: Self-pay | Admitting: Family Medicine

## 2020-01-03 VITALS — BP 120/70 | HR 76 | Ht 69.0 in | Wt 186.2 lb

## 2020-01-03 DIAGNOSIS — Z13220 Encounter for screening for lipoid disorders: Secondary | ICD-10-CM

## 2020-01-03 DIAGNOSIS — D509 Iron deficiency anemia, unspecified: Secondary | ICD-10-CM | POA: Diagnosis not present

## 2020-01-03 DIAGNOSIS — M5440 Lumbago with sciatica, unspecified side: Secondary | ICD-10-CM

## 2020-01-03 DIAGNOSIS — Z1159 Encounter for screening for other viral diseases: Secondary | ICD-10-CM | POA: Diagnosis not present

## 2020-01-03 DIAGNOSIS — R040 Epistaxis: Secondary | ICD-10-CM

## 2020-01-03 DIAGNOSIS — R21 Rash and other nonspecific skin eruption: Secondary | ICD-10-CM | POA: Diagnosis not present

## 2020-01-03 DIAGNOSIS — Z0000 Encounter for general adult medical examination without abnormal findings: Secondary | ICD-10-CM | POA: Diagnosis not present

## 2020-01-03 DIAGNOSIS — Z23 Encounter for immunization: Secondary | ICD-10-CM

## 2020-01-03 DIAGNOSIS — I10 Essential (primary) hypertension: Secondary | ICD-10-CM

## 2020-01-03 DIAGNOSIS — Z114 Encounter for screening for human immunodeficiency virus [HIV]: Secondary | ICD-10-CM

## 2020-01-03 DIAGNOSIS — G8929 Other chronic pain: Secondary | ICD-10-CM

## 2020-01-03 DIAGNOSIS — Z125 Encounter for screening for malignant neoplasm of prostate: Secondary | ICD-10-CM

## 2020-01-03 MED ORDER — PREDNISONE 10 MG (21) PO TBPK: ORAL_TABLET | Freq: Every day | ORAL | 0 refills | Status: DC

## 2020-01-03 MED ORDER — METHOCARBAMOL 500 MG PO TABS: 500.0000 mg | ORAL_TABLET | Freq: Four times a day (QID) | ORAL | 1 refills | Status: DC

## 2020-01-03 NOTE — Patient Instructions (Addendum)
Take the steroid dose in the morning as recommended.   Use sunscreen and protect your skin. Avoid tanning beds.   We will be in touch with your lab results.    Preventive Care 60-60 Years Old, Male Preventive care refers to lifestyle choices and visits with your health care provider that can promote health and wellness. This includes:  A yearly physical exam. This is also called an annual well check.  Regular dental and eye exams.  Immunizations.  Screening for certain conditions.  Healthy lifestyle choices, such as eating a healthy diet, getting regular exercise, not using drugs or products that contain nicotine and tobacco, and limiting alcohol use. What can I expect for my preventive care visit? Physical exam Your health care provider will check:  Height and weight. These may be used to calculate body mass index (BMI), which is a measurement that tells if you are at a healthy weight.  Heart rate and blood pressure.  Your skin for abnormal spots. Counseling Your health care provider may ask you questions about:  Alcohol, tobacco, and drug use.  Emotional well-being.  Home and relationship well-being.  Sexual activity.  Eating habits.  Work and work Statistician. What immunizations do I need?  Influenza (flu) vaccine  This is recommended every year. Tetanus, diphtheria, and pertussis (Tdap) vaccine  You may need a Td booster every 10 years. Varicella (chickenpox) vaccine  You may need this vaccine if you have not already been vaccinated. Zoster (shingles) vaccine  You may need this after age 6. Measles, mumps, and rubella (MMR) vaccine  You may need at least one dose of MMR if you were born in 1957 or later. You may also need a second dose. Pneumococcal conjugate (PCV13) vaccine  You may need this if you have certain conditions and were not previously vaccinated. Pneumococcal polysaccharide (PPSV23) vaccine  You may need one or two doses if you smoke  cigarettes or if you have certain conditions. Meningococcal conjugate (MenACWY) vaccine  You may need this if you have certain conditions. Hepatitis A vaccine  You may need this if you have certain conditions or if you travel or work in places where you may be exposed to hepatitis A. Hepatitis B vaccine  You may need this if you have certain conditions or if you travel or work in places where you may be exposed to hepatitis B. Haemophilus influenzae type b (Hib) vaccine  You may need this if you have certain risk factors. Human papillomavirus (HPV) vaccine  If recommended by your health care provider, you may need three doses over 6 months. You may receive vaccines as individual doses or as more than one vaccine together in one shot (combination vaccines). Talk with your health care provider about the risks and benefits of combination vaccines. What tests do I need? Blood tests  Lipid and cholesterol levels. These may be checked every 5 years, or more frequently if you are over 64 years old.  Hepatitis C test.  Hepatitis B test. Screening  Lung cancer screening. You may have this screening every year starting at age 25 if you have a 30-pack-year history of smoking and currently smoke or have quit within the past 15 years.  Prostate cancer screening. Recommendations will vary depending on your family history and other risks.  Colorectal cancer screening. All adults should have this screening starting at age 30 and continuing until age 27. Your health care provider may recommend screening at age 11 if you are at increased risk.  You will have tests every 1-10 years, depending on your results and the type of screening test.  Diabetes screening. This is done by checking your blood sugar (glucose) after you have not eaten for a while (fasting). You may have this done every 1-3 years.  Sexually transmitted disease (STD) testing. Follow these instructions at home: Eating and  drinking  Eat a diet that includes fresh fruits and vegetables, whole grains, lean protein, and low-fat dairy products.  Take vitamin and mineral supplements as recommended by your health care provider.  Do not drink alcohol if your health care provider tells you not to drink.  If you drink alcohol: ? Limit how much you have to 0-2 drinks a day. ? Be aware of how much alcohol is in your drink. In the [place] one drink equals one 12 oz bottle of beer (355 mL), one 5 oz glass of wine (148 mL), or one 1 oz glass of hard liquor (44 mL). Lifestyle  Take daily care of your teeth and gums.  Stay active. Exercise for at least 30 minutes on 5 or more days each week.  Do not use any products that contain nicotine or tobacco, such as cigarettes, e-cigarettes, and chewing tobacco. If you need help quitting, ask your health care provider.  If you are sexually active, practice safe sex. Use a condom or other form of protection to prevent STIs (sexually transmitted infections).  Talk with your health care provider about taking a low-dose aspirin every day starting at age 50. What's next?  Go to your health care provider once a year for a well check visit.  Ask your health care provider how often you should have your eyes and teeth checked.  Stay up to date on all vaccines. This information is not intended to replace advice given to you by your health care provider. Make sure you discuss any questions you have with your health care provider. Document Revised: 07/07/2018 Document Reviewed: 07/07/2018 [employer] Patient Education  2020 [employer]  

## 2020-01-03 NOTE — Progress Notes (Signed)
Subjective:    Patient ID: Jackson Donaldson, male    DOB: [birth year], 60 y.o.   MRN: [id number]  HPI Chief Complaint  Patient presents with   cpe    non fasting cpe, no other concerns   He is here for a complete physical exam. Last CPE: years ago  Other providers: GI- Dr. Loletha Carrow  ENT- Dr. Lucia Gaskins   Complains of a 2 week history of non pruritic rash on his back, upper abdomen and bilateral arms. The rash is worse on his forearms. No known tickbite.  No change in soaps, lotions, detergents, etc.  He does go to the tanning bed and gets quite a lot of sun exposure.  No recent antibiotic or other medication to increase sun sensitivity.  No rash on his palms or soles.    HTN- taking BP medication and doing well on it.   Reports taking a multi-vitamin daily   IDA- last hgb 11.3.  He had an EGD and colonoscopy for this. He has been taking iron once daily for the past 6-8 weeks.  Erosive gastritis per GI  Nose bleeds from severely deviated septum and has surgery scheduled in early July to fix this.  No dizziness, chest pain, shortness of breath.   Requests refill of Robaxin. States he takes this occasionally for low back pain. 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 No new or worsening back pain or radiculopathy.    Social history: Lives with girlfriend and son, works as Network engineer  Denies smoking, drug use. Alcohol use - drinks beer 2 days per week  Diet: fairly healthy  Exercise: active job   Immunizations: does not want Covid vaccine. Tdap more than 10 years per patient.   Health maintenance:  Colonoscopy: 10/2019 and due for recall in 10 years  Last PSA: never  Last Dental Exam: years ago  Last Eye Exam: years ago   Wears seatbelt always, uses sunscreen, smoke detectors in home and functioning, does not text while driving, feels safe in home environment.  Reviewed allergies, medications, past medical, surgical, family, and social  history.   Review of Systems Review of Systems Constitutional: -fever, -chills, -sweats, -unexpected weight change,-fatigue ENT: -runny nose, -ear pain, -sore throat Cardiology:  -chest pain, -palpitations, -edema Respiratory: -cough, -shortness of breath, -wheezing Gastroenterology: -abdominal pain, -nausea, -vomiting, -diarrhea, -constipation  Hematology: -bleeding or bruising problems Musculoskeletal: -arthralgias, -myalgias, -joint swelling, -back pain Ophthalmology: -vision changes Urology: -dysuria, -difficulty urinating, -hematuria, -urinary frequency, -urgency Neurology: -headache, -weakness, -tingling, -numbness       Objective:   Physical Exam BP 120/70    Pulse 76    Ht 5\' 9"  (1.753 m)    Wt 186 lb 3.2 oz (84.5 kg)    BMI 27.50 kg/m   General Appearance:    Alert, cooperative, no distress, appears stated age  Head:    Normocephalic, without obvious abnormality, atraumatic  Eyes:    PERRL, conjunctiva/corneas clear, EOM's intact  Ears:    Normal TM's and external ear canals  Nose:   Mask in place   Throat:   Mask in place  Neck:   Supple, no lymphadenopathy;  thyroid:  no   enlargement/tenderness/nodules; no  JVD  Back:    Spine nontender, no curvature, ROM normal, no CVA     tenderness  Lungs:     Clear to auscultation bilaterally without wheezes, rales or     ronchi; respirations unlabored  Chest Wall:    No tenderness  or deformity   Heart:    Regular rate and rhythm, S1 and S2 normal, no murmur, rub   or gallop  Breast Exam:    No chest wall tenderness, masses or gynecomastia  Abdomen:     Soft, non-tender, nondistended, normoactive bowel sounds,    no masses, no hepatosplenomegaly  Genitalia:    Declines      Extremities:   No clubbing, cyanosis or edema  Pulses:   2+ and symmetric all extremities  Skin:   Skin color, texture, turgor normal. A red papular rash on his upper back, upper abdomen and bilateral arms, much worse on his posterior forearms. He has some  scaly on the forearms as well. No sign of infection.   Lymph nodes:   Cervical, supraclavicular, and axillary nodes normal  Neurologic:   CNII-XII intact, normal strength, sensation and gait; reflexes 2+ and symmetric throughout          Psych:   Normal mood, affect, hygiene and grooming.         Assessment & Plan:  Routine general medical examination at a health care facility - Plan: CBC with Differential/Platelet, Comprehensive metabolic panel, TSH, T4, free, Lipid panel -He is here today for CPE.  He is not fasting.  We will have him come back tomorrow or Friday morning for fasting labs.  Preventive health care reviewed.  Up-to-date on colonoscopy.  He has never had a PSA blood test.  We discussed the controversial nature of this and he would like to have this done.  Counseling on healthy lifestyle including diet and exercise and limiting sun exposure.  Recommend using sunscreen, sun shirts and avoiding the tanning bed.  Immunizations reviewed and Tdap updated.  Discussed safety.  Essential hypertension - Plan: CBC with Differential/Platelet, Comprehensive metabolic panel -Blood pressure controlled.  Continue on lisinopril/HCTZ.  Recommend low-sodium diet. Need for hepatitis C screening test - Plan: Hepatitis C antibody  Iron deficiency anemia, unspecified iron deficiency anemia type - Plan: CBC with Differential/Platelet, Iron, TIBC and Ferritin Panel -Recent evaluation by GI showed erosive gastritis and he is supposed to be taking his PPI daily.  Recommend that he take this daily as he has been missing some doses.  I will check CBC and iron studies.  He will continue on daily iron.  Right-sided epistaxis -This is due to severely deviated septum.  Followed by ENT and has surgery scheduled for July 6  Need for diphtheria-tetanus-pertussis (Tdap) vaccine - Plan: Tdap vaccine greater than or equal to 7yo IM -Counseling done on all components of the vaccine.  Screening for HIV without  presence of risk factors - Plan: HIV Antibody (routine testing w rflx) -This was done per guidelines  Rash and nonspecific skin eruption - Plan: RPR, predniSONE (STERAPRED UNI-PAK 21 TAB) 10 MG (21) TBPK tablet -Unclear etiology for his rash.  He will limit sun exposure.  Oral steroids prescribed and potential side effects were discussed.  He will wait and start these in the morning so that he may try to prevent insomnia from the medication.  Screening for lipid disorders - Plan: Lipid panel -Follow-up pending results.  Counseling on healthy diet and exercise  Screening for prostate cancer - Plan: PSA -Done per guidelines  Chronic bilateral low back pain with sciatica, sciatica laterality unspecified - Plan: methocarbamol (ROBAXIN) 500 MG tablet -He takes Robaxin sporadically and I am okay with this.

## 2020-01-04 ENCOUNTER — Other Ambulatory Visit: Payer: 59

## 2020-01-04 DIAGNOSIS — Z13220 Encounter for screening for lipoid disorders: Secondary | ICD-10-CM

## 2020-01-04 DIAGNOSIS — I10 Essential (primary) hypertension: Secondary | ICD-10-CM

## 2020-01-04 DIAGNOSIS — Z125 Encounter for screening for malignant neoplasm of prostate: Secondary | ICD-10-CM

## 2020-01-04 DIAGNOSIS — R21 Rash and other nonspecific skin eruption: Secondary | ICD-10-CM

## 2020-01-04 DIAGNOSIS — Z1159 Encounter for screening for other viral diseases: Secondary | ICD-10-CM

## 2020-01-04 DIAGNOSIS — Z114 Encounter for screening for human immunodeficiency virus [HIV]: Secondary | ICD-10-CM

## 2020-01-04 DIAGNOSIS — Z0000 Encounter for general adult medical examination without abnormal findings: Secondary | ICD-10-CM

## 2020-01-04 DIAGNOSIS — D509 Iron deficiency anemia, unspecified: Secondary | ICD-10-CM

## 2020-01-04 LAB — CBC WITH DIFFERENTIAL/PLATELET
Basophils Absolute: 0 10*3/uL (ref 0.0–0.2)
Basos: 1 %
EOS (ABSOLUTE): 0.3 10*3/uL (ref 0.0–0.4)
Eos: 5 %
Hematocrit: 45.8 % (ref 37.5–51.0)
Hemoglobin: 14.9 g/dL (ref 13.0–17.7)
Immature Grans (Abs): 0 10*3/uL (ref 0.0–0.1)
Immature Granulocytes: 0 %
Lymphocytes Absolute: 1.3 10*3/uL (ref 0.7–3.1)
Lymphs: 27 %
MCH: 29.2 pg (ref 26.6–33.0)
MCHC: 32.5 g/dL (ref 31.5–35.7)
MCV: 90 fL (ref 79–97)
Monocytes Absolute: 0.4 10*3/uL (ref 0.1–0.9)
Monocytes: 8 %
Neutrophils Absolute: 2.8 10*3/uL (ref 1.4–7.0)
Neutrophils: 59 %
Platelets: 222 10*3/uL (ref 150–450)
RBC: 5.11 x10E6/uL (ref 4.14–5.80)
RDW: 17.9 % — ABNORMAL HIGH (ref 11.6–15.4)
WBC: 4.8 10*3/uL (ref 3.4–10.8)

## 2020-01-04 LAB — COMPREHENSIVE METABOLIC PANEL WITH GFR
ALT: 20 IU/L (ref 0–44)
AST: 32 IU/L (ref 0–40)
Albumin/Globulin Ratio: 1.1 — ABNORMAL LOW (ref 1.2–2.2)
Albumin: 4.5 g/dL (ref 3.8–4.9)
Alkaline Phosphatase: 88 IU/L (ref 48–121)
BUN/Creatinine Ratio: 25 — ABNORMAL HIGH (ref 9–20)
BUN: 29 mg/dL — ABNORMAL HIGH (ref 6–24)
Bilirubin Total: 0.5 mg/dL (ref 0.0–1.2)
CO2: 26 mmol/L (ref 20–29)
Calcium: 10 mg/dL (ref 8.7–10.2)
Chloride: 99 mmol/L (ref 96–106)
Creatinine, Ser: 1.18 mg/dL (ref 0.76–1.27)
GFR calc Af Amer: 78 mL/min/1.73 (ref >59)
GFR calc non Af Amer: 67 mL/min/1.73 (ref >59)
Globulin, Total: 4.1 g/dL (ref 1.5–4.5)
Glucose: 105 mg/dL — ABNORMAL HIGH (ref 65–99)
Potassium: 4.9 mmol/L (ref 3.5–5.2)
Sodium: 135 mmol/L (ref 134–144)
Total Protein: 8.6 g/dL — ABNORMAL HIGH (ref 6.0–8.5)

## 2020-01-05 LAB — TSH: TSH: 1.4 u[IU]/mL (ref 0.450–4.500)

## 2020-01-05 LAB — IRON,TIBC AND FERRITIN PANEL
Ferritin: 54 ng/mL (ref 30–400)
Iron Saturation: 19 % (ref 15–55)
Iron: 74 ug/dL (ref 38–169)
Total Iron Binding Capacity: 382 ug/dL (ref 250–450)
UIBC: 308 ug/dL (ref 111–343)

## 2020-01-05 LAB — LIPID PANEL
Chol/HDL Ratio: 3.8 ratio (ref 0.0–5.0)
Cholesterol, Total: 204 mg/dL — ABNORMAL HIGH (ref 100–199)
HDL: 53 mg/dL (ref >39)
LDL Chol Calc (NIH): 131 mg/dL — ABNORMAL HIGH (ref 0–99)
Triglycerides: 111 mg/dL (ref 0–149)
VLDL Cholesterol Cal: 20 mg/dL (ref 5–40)

## 2020-01-05 LAB — T4, FREE: Free T4: 1.1 ng/dL (ref 0.82–1.77)

## 2020-01-05 LAB — RPR: RPR Ser Ql: NONREACTIVE

## 2020-01-05 LAB — HIV ANTIBODY (ROUTINE TESTING W REFLEX): HIV Screen 4th Generation wRfx: NONREACTIVE

## 2020-01-05 LAB — HEPATITIS C ANTIBODY: Hep C Virus Ab: <0.1 s/co ratio (ref 0.0–0.9)

## 2020-01-05 LAB — PSA: Prostate Specific Ag, Serum: 0.6 ng/mL (ref 0.0–4.0)

## 2020-01-06 DIAGNOSIS — R0683 Snoring: Secondary | ICD-10-CM

## 2020-01-06 NOTE — Procedures (Signed)
    Patient Name: Jackson Donaldson, [place] Date: 01/01/2020 Gender: Male D.O.B: 04-09-1961 Age (years): 52 Referring Provider: Avanell Shackleton NP Height (inches): 70 Interpreting Physician: Jetty Duhamel MD, ABSM Weight (lbs): 192 RPSGT: [place] Sink BMI: 28 MRN: [id number] Neck Size: 16.00  CLINICAL INFORMATION Sleep Study Type: HST Indication for sleep study: OSA Epworth Sleepiness Score: 6  SLEEP STUDY TECHNIQUE A multi-channel overnight portable sleep study was performed. The channels recorded were: nasal airflow, thoracic respiratory movement, and oxygen saturation with a pulse oximetry. Snoring was also monitored.  MEDICATIONS Patient self administered medications include: none reported.  SLEEP ARCHITECTURE Patient was studied for 555.9 minutes. The sleep efficiency was 100.0 % and the patient was supine for 0%. The arousal index was 0.0 per hour.  RESPIRATORY PARAMETERS The overall AHI was 45.2 per hour, with a central apnea index of 0.0 per hour. The oxygen nadir was 73% during sleep.  CARDIAC DATA Mean heart rate during sleep was 66.5 bpm.  IMPRESSIONS - Severe obstructive sleep apnea occurred during this study (AHI = 45.2/h). - No significant central sleep apnea occurred during this study (CAI = 0.0/h). - Oxygen desaturation was noted during this study (Min O2 = 73%). Mean sat 93%. - Patient snored.  DIAGNOSIS - Obstructive Sleep Apnea (327.23 [G47.33 ICD-10])  RECOMMENDATIONS - Suggest CPAP titration sleep study or autopap. Other options would be based on clinical judgment. - Be careful with alcohol, sedatives and other CNS depressants that may worsen sleep apnea and disrupt normal sleep architecture. - Sleep hygiene should be reviewed to assess factors that may improve sleep quality. - Weight management and regular exercise should be initiated or continued.  [Electronically signed] 01/06/2020 11:10 AM  Jetty Duhamel MD, ABSM Diplomate, American  Board of Sleep Medicine   NPI: [id number]                         Jetty Duhamel Diplomate, American Board of Sleep Medicine  ELECTRONICALLY SIGNED ON:  01/06/2020, 11:08 AM [facility] PH: [phone]   FX: [phone] ACCREDITED BY THE AMERICAN ACADEMY OF SLEEP MEDICINE

## 2020-01-07 NOTE — Progress Notes (Signed)
His sleep showed severe OSA. He will most likely need a CPAP but let's have him discuss this with his ENT since he is schedule for nasal surgery due to his deviated septum. We can order the CPAP for him if his ENT thinks this will not improve with his surgery.

## 2020-01-19 ENCOUNTER — Encounter (HOSPITAL_BASED_OUTPATIENT_CLINIC_OR_DEPARTMENT_OTHER): Payer: Self-pay | Admitting: Otolaryngology

## 2020-01-19 ENCOUNTER — Other Ambulatory Visit: Payer: Self-pay

## 2020-01-26 ENCOUNTER — Encounter (HOSPITAL_BASED_OUTPATIENT_CLINIC_OR_DEPARTMENT_OTHER)
Admission: RE | Admit: 2020-01-26 | Discharge: 2020-01-26 | Disposition: A | Discharge status: Home / Self Care | Payer: 59 | Source: Ambulatory Visit | Attending: Otolaryngology | Admitting: Otolaryngology

## 2020-01-26 ENCOUNTER — Other Ambulatory Visit (HOSPITAL_COMMUNITY)
Admission: RE | Admit: 2020-01-26 | Discharge: 2020-01-26 | Disposition: A | Discharge status: Home / Self Care | Payer: 59 | Source: Ambulatory Visit | Attending: Otolaryngology | Admitting: Otolaryngology

## 2020-01-26 ENCOUNTER — Ambulatory Visit (INDEPENDENT_AMBULATORY_CARE_PROVIDER_SITE_OTHER): Payer: Self-pay | Admitting: Otolaryngology

## 2020-01-26 DIAGNOSIS — Z20822 Contact with and (suspected) exposure to covid-19: Secondary | ICD-10-CM | POA: Diagnosis not present

## 2020-01-26 DIAGNOSIS — J342 Deviated nasal septum: Secondary | ICD-10-CM

## 2020-01-26 DIAGNOSIS — Z01812 Encounter for preprocedural laboratory examination: Secondary | ICD-10-CM | POA: Insufficient documentation

## 2020-01-26 LAB — BASIC METABOLIC PANEL
Anion gap: 9 (ref 5–15)
BUN: 28 mg/dL — ABNORMAL HIGH (ref 6–20)
CO2: 21 mmol/L — ABNORMAL LOW (ref 22–32)
Calcium: 9.7 mg/dL (ref 8.9–10.3)
Chloride: 105 mmol/L (ref 98–111)
Creatinine, Ser: 1.93 mg/dL — ABNORMAL HIGH (ref 0.61–1.24)
GFR calc Af Amer: 43 mL/min — ABNORMAL LOW (ref >60)
GFR calc non Af Amer: 37 mL/min — ABNORMAL LOW (ref >60)
Glucose, Bld: 136 mg/dL — ABNORMAL HIGH (ref 70–99)
Potassium: 4.9 mmol/L (ref 3.5–5.1)
Sodium: 135 mmol/L (ref 135–145)

## 2020-01-26 LAB — SARS CORONAVIRUS 2 (TAT 6-24 HRS): SARS Coronavirus 2: NEGATIVE

## 2020-01-26 NOTE — Progress Notes (Signed)
Surgical soap given with instructions, pt verbalized understanding.  

## 2020-01-26 NOTE — H&P (View-Only) (Signed)
PREOPERATIVE H&P  Chief Complaint: Chronic nasal obstruction  HPI: Jackson Donaldson is a 59 y.o. male who presents for evaluation of chronic nasal obstruction that he has had for a number of years.  He has had a known septal deviation and crooked nose his entire life.  But as he has gotten older he has had more trouble breathing through his nose he describes drainage drying and crusting on the right side and difficulty breathing through the left side.  On exam he has a severe deviation of the septum to the left with a very crooked nose.  Reviewed with him concerning septorhinoplasty and turbinate reductions to help improve his nasal breathing.  Past Medical History:  Diagnosis Date  . Anemia   . GERD (gastroesophageal reflux disease)   . Hypertension   . Iron deficiency anemia   . Kidney stones    Past Surgical History:  Procedure Laterality Date  . no past surgeries     Social History   Socioeconomic History  . Marital status: Divorced    Spouse name: Not on file  . Number of children: 2  . Years of education: Not on file  . Highest education level: Not on file  Occupational History  . Occupation: Auto-tech-owner  Tobacco Use  . Smoking status: Never Smoker  . Smokeless tobacco: Never Used  Vaping Use  . Vaping Use: Never used  Substance and Sexual Activity  . Alcohol use: Yes    Alcohol/week: 12.0 - 20.0 standard drinks    Types: 12 - 20 Cans of beer per week    Comment: "on the weekend"  . Drug use: No  . Sexual activity: Not on file  Other Topics Concern  . Not on file  Social History Narrative  . Not on file   Social Determinants of Health   Financial Resource Strain:   . Difficulty of Paying Living Expenses:   Food Insecurity:   . Worried About Running Out of Food in the Last Year:   . Ran Out of Food in the Last Year:   Transportation Needs:   . Lack of Transportation (Medical):   . Lack of Transportation (Non-Medical):   Physical Activity:   . Days  of Exercise per Week:   . Minutes of Exercise per Session:   Stress:   . Feeling of Stress :   Social Connections:   . Frequency of Communication with Friends and Family:   . Frequency of Social Gatherings with Friends and Family:   . Attends Religious Services:   . Active Member of Clubs or Organizations:   . Attends Club or Organization Meetings:   . Marital Status:    Family History  Problem Relation Age of Onset  . Diabetes Mother   . Breast cancer Sister   . Lung cancer Sister   . Colon cancer Neg Hx   . Esophageal cancer Neg Hx   . Rectal cancer Neg Hx    No Known Allergies Prior to Admission medications   Medication Sig Start Date End Date Taking? Authorizing Provider  ferrous sulfate 325 (65 FE) MG EC tablet Take 325 mg by mouth 3 (three) times daily with meals.    [provider]  lisinopril-hydrochlorothiazide (ZESTORETIC) 10-12.5 MG tablet Take 1 tablet by mouth daily. 11/29/19   Henson, Vickie L, NP-C  methocarbamol (ROBAXIN) 500 MG tablet Take 1 tablet (500 mg total) by mouth 4 (four) times daily. 01/03/20   Henson, Vickie L, NP-C  omeprazole (PRILOSEC) 20 MG   capsule Take 20 mg by mouth daily.    [provider]     Positive ROS: Otherwise negative  All other systems have been reviewed and were otherwise negative with the exception of those mentioned in the HPI and as above.  Physical Exam: There were no vitals filed for this visit.  General: Alert, no acute distress Oral: Normal oral mucosa and tonsils Nasal: External nose is deviated to the left.  Septum is severely deviated to the left with complete obstruction of the left nasal passageway.  Both middle meatus regions were clear. Neck: No palpable adenopathy or thyroid nodules Ear: Ear canal is clear with normal appearing TMs Cardiovascular: Regular rate and rhythm, no murmur.  Respiratory: Clear to auscultation Neurologic: Alert and oriented x 3   Assessment/Plan: Nasal septal deviation  with nasal obstruction  Plan for septorhinoplasty with bilateral inferior turbinate reductions.   Jackson Cannon, MD 01/26/2020 10:00 AM

## 2020-01-26 NOTE — H&P (Signed)
PREOPERATIVE H&P  Chief Complaint: Chronic nasal obstruction  HPI: Jackson Donaldson is a 59 y.o. male who presents for evaluation of chronic nasal obstruction that he has had for a number of years.  He has had a known septal deviation and crooked nose his entire life.  But as he has gotten older he has had more trouble breathing through his nose he describes drainage drying and crusting on the right side and difficulty breathing through the left side.  On exam he has a severe deviation of the septum to the left with a very crooked nose.  Reviewed with him concerning septorhinoplasty and turbinate reductions to help improve his nasal breathing.  Past Medical History:  Diagnosis Date  . Anemia   . GERD (gastroesophageal reflux disease)   . Hypertension   . Iron deficiency anemia   . Kidney stones    Past Surgical History:  Procedure Laterality Date  . no past surgeries     Social History   Socioeconomic History  . Marital status: Divorced    Spouse name: Not on file  . Number of children: 2  . Years of education: Not on file  . Highest education level: Not on file  Occupational History  . Occupation: Auto-tech-owner  Tobacco Use  . Smoking status: Never Smoker  . Smokeless tobacco: Never Used  Vaping Use  . Vaping Use: Never used  Substance and Sexual Activity  . Alcohol use: Yes    Alcohol/week: 12.0 - 20.0 standard drinks    Types: 12 - 20 Cans of beer per week    Comment: "on the weekend"  . Drug use: No  . Sexual activity: Not on file  Other Topics Concern  . Not on file  Social History Narrative  . Not on file   Social Determinants of Health   Financial Resource Strain:   . Difficulty of Paying Living Expenses:   Food Insecurity:   . Worried About Programme researcher, broadcasting/film/video in the Last Year:   . Barista in the Last Year:   Transportation Needs:   . Freight forwarder (Medical):   Marland Kitchen Lack of Transportation (Non-Medical):   Physical Activity:   . Days  of Exercise per Week:   . Minutes of Exercise per Session:   Stress:   . Feeling of Stress :   Social Connections:   . Frequency of Communication with Friends and Family:   . Frequency of Social Gatherings with Friends and Family:   . Attends Religious Services:   . Active Member of Clubs or Organizations:   . Attends Banker Meetings:   Marland Kitchen Marital Status:    Family History  Problem Relation Age of Onset  . Diabetes Mother   . Breast cancer Sister   . Lung cancer Sister   . Colon cancer Neg Hx   . Esophageal cancer Neg Hx   . Rectal cancer Neg Hx    No Known Allergies Prior to Admission medications   Medication Sig Start Date End Date Taking? Authorizing Provider  ferrous sulfate 325 (65 FE) MG EC tablet Take 325 mg by mouth 3 (three) times daily with meals.    [provider]  lisinopril-hydrochlorothiazide (ZESTORETIC) 10-12.5 MG tablet Take 1 tablet by mouth daily. 11/29/19   Henson, Vickie L, NP-C  methocarbamol (ROBAXIN) 500 MG tablet Take 1 tablet (500 mg total) by mouth 4 (four) times daily. 01/03/20   Henson, Vickie L, NP-C  omeprazole (PRILOSEC) 20 MG  capsule Take 20 mg by mouth daily.    [provider]     Positive ROS: Otherwise negative  All other systems have been reviewed and were otherwise negative with the exception of those mentioned in the HPI and as above.  Physical Exam: There were no vitals filed for this visit.  General: Alert, no acute distress Oral: Normal oral mucosa and tonsils Nasal: External nose is deviated to the left.  Septum is severely deviated to the left with complete obstruction of the left nasal passageway.  Both middle meatus regions were clear. Neck: No palpable adenopathy or thyroid nodules Ear: Ear canal is clear with normal appearing TMs Cardiovascular: Regular rate and rhythm, no murmur.  Respiratory: Clear to auscultation Neurologic: Alert and oriented x 3   Assessment/Plan: Nasal septal deviation  with nasal obstruction  Plan for septorhinoplasty with bilateral inferior turbinate reductions.   [name], MD 01/26/2020 10:00 AM

## 2020-01-30 ENCOUNTER — Encounter (HOSPITAL_BASED_OUTPATIENT_CLINIC_OR_DEPARTMENT_OTHER): Payer: Self-pay | Admitting: Otolaryngology

## 2020-01-30 ENCOUNTER — Other Ambulatory Visit: Payer: Self-pay | Admitting: Family Medicine

## 2020-01-30 ENCOUNTER — Other Ambulatory Visit: Payer: Self-pay

## 2020-01-30 ENCOUNTER — Ambulatory Visit (HOSPITAL_BASED_OUTPATIENT_CLINIC_OR_DEPARTMENT_OTHER): Payer: 59 | Admitting: Certified Registered"

## 2020-01-30 ENCOUNTER — Ambulatory Visit (HOSPITAL_BASED_OUTPATIENT_CLINIC_OR_DEPARTMENT_OTHER)
Admission: RE | Admit: 2020-01-30 | Discharge: 2020-01-30 | Disposition: A | Discharge status: Home / Self Care | Payer: 59 | Attending: Otolaryngology | Admitting: Otolaryngology

## 2020-01-30 ENCOUNTER — Encounter (HOSPITAL_BASED_OUTPATIENT_CLINIC_OR_DEPARTMENT_OTHER)
Admission: RE | Disposition: A | Discharge status: Home / Self Care | Payer: Self-pay | Source: Home / Self Care | Attending: Otolaryngology

## 2020-01-30 DIAGNOSIS — J343 Hypertrophy of nasal turbinates: Secondary | ICD-10-CM | POA: Diagnosis not present

## 2020-01-30 DIAGNOSIS — J3489 Other specified disorders of nose and nasal sinuses: Secondary | ICD-10-CM | POA: Insufficient documentation

## 2020-01-30 DIAGNOSIS — Z801 Family history of malignant neoplasm of trachea, bronchus and lung: Secondary | ICD-10-CM | POA: Diagnosis not present

## 2020-01-30 DIAGNOSIS — Z87442 Personal history of urinary calculi: Secondary | ICD-10-CM | POA: Diagnosis not present

## 2020-01-30 DIAGNOSIS — I10 Essential (primary) hypertension: Secondary | ICD-10-CM | POA: Diagnosis not present

## 2020-01-30 DIAGNOSIS — M950 Acquired deformity of nose: Secondary | ICD-10-CM | POA: Insufficient documentation

## 2020-01-30 DIAGNOSIS — K219 Gastro-esophageal reflux disease without esophagitis: Secondary | ICD-10-CM | POA: Insufficient documentation

## 2020-01-30 DIAGNOSIS — Z803 Family history of malignant neoplasm of breast: Secondary | ICD-10-CM | POA: Diagnosis not present

## 2020-01-30 DIAGNOSIS — J342 Deviated nasal septum: Secondary | ICD-10-CM | POA: Diagnosis not present

## 2020-01-30 DIAGNOSIS — Z833 Family history of diabetes mellitus: Secondary | ICD-10-CM | POA: Diagnosis not present

## 2020-01-30 DIAGNOSIS — Z79899 Other long term (current) drug therapy: Secondary | ICD-10-CM | POA: Insufficient documentation

## 2020-01-30 DIAGNOSIS — R0981 Nasal congestion: Secondary | ICD-10-CM | POA: Diagnosis not present

## 2020-01-30 HISTORY — PX: NASAL SEPTOPLASTY W/ TURBINOPLASTY: SHX2070

## 2020-01-30 HISTORY — PX: RHINOPLASTY: SHX2354

## 2020-01-30 SURGERY — SEPTOPLASTY, NOSE, WITH NASAL TURBINATE REDUCTION
Anesthesia: General | Site: Nose

## 2020-01-30 MED ORDER — CEFAZOLIN SODIUM-DEXTROSE 2-4 GM/100ML-% IV SOLN
2.0000 g | INTRAVENOUS | Status: AC
  Administered 2020-01-30: 2 g via INTRAVENOUS

## 2020-01-30 MED ORDER — BACITRACIN ZINC 500 UNIT/GM EX OINT
TOPICAL_OINTMENT | CUTANEOUS | Status: AC
  Filled 2020-01-30: qty 28.35

## 2020-01-30 MED ORDER — FENTANYL CITRATE (PF) 100 MCG/2ML IJ SOLN
INTRAMUSCULAR | Status: AC
  Filled 2020-01-30: qty 2

## 2020-01-30 MED ORDER — METHYLPREDNISOLONE ACETATE 80 MG/ML IJ SUSP
INTRAMUSCULAR | Status: AC
  Filled 2020-01-30: qty 1

## 2020-01-30 MED ORDER — LIDOCAINE-EPINEPHRINE 1 %-1:100000 IJ SOLN
INTRAMUSCULAR | Status: DC | PRN
  Administered 2020-01-30: 12 mL

## 2020-01-30 MED ORDER — OXYCODONE HCL 5 MG PO TABS
ORAL_TABLET | ORAL | Status: AC
  Filled 2020-01-30: qty 1

## 2020-01-30 MED ORDER — SUGAMMADEX SODIUM 500 MG/5ML IV SOLN
INTRAVENOUS | Status: AC
  Filled 2020-01-30: qty 5

## 2020-01-30 MED ORDER — LIDOCAINE-EPINEPHRINE 1 %-1:100000 IJ SOLN
INTRAMUSCULAR | Status: AC
  Filled 2020-01-30: qty 2

## 2020-01-30 MED ORDER — CIPROFLOXACIN-DEXAMETHASONE 0.3-0.1 % OT SUSP
OTIC | Status: AC
  Filled 2020-01-30: qty 7.5

## 2020-01-30 MED ORDER — CEPHALEXIN 500 MG PO CAPS: 500.0000 mg | ORAL_CAPSULE | Freq: Two times a day (BID) | ORAL | 0 refills | Status: DC

## 2020-01-30 MED ORDER — OXYMETAZOLINE HCL 0.05 % NA SOLN
NASAL | Status: DC | PRN
  Administered 2020-01-30: 1 via TOPICAL

## 2020-01-30 MED ORDER — DEXAMETHASONE SODIUM PHOSPHATE 4 MG/ML IJ SOLN
INTRAMUSCULAR | Status: DC | PRN
  Administered 2020-01-30: 10 mg via INTRAVENOUS

## 2020-01-30 MED ORDER — LACTATED RINGERS IV SOLN: INTRAVENOUS | Status: DC

## 2020-01-30 MED ORDER — CHLORHEXIDINE GLUCONATE CLOTH 2 % EX PADS: 6.0000 | MEDICATED_PAD | Freq: Once | CUTANEOUS | Status: DC

## 2020-01-30 MED ORDER — SODIUM CHLORIDE (PF) 0.9 % IJ SOLN
INTRAMUSCULAR | Status: AC
  Filled 2020-01-30: qty 10

## 2020-01-30 MED ORDER — HYDROCODONE-ACETAMINOPHEN 5-325 MG PO TABS: 1.0000 | ORAL_TABLET | Freq: Four times a day (QID) | ORAL | 0 refills | Status: DC | PRN

## 2020-01-30 MED ORDER — PROPOFOL 10 MG/ML IV BOLUS
INTRAVENOUS | Status: DC | PRN
  Administered 2020-01-30: 150 mg via INTRAVENOUS
  Administered 2020-01-30: 50 mg via INTRAVENOUS

## 2020-01-30 MED ORDER — HYDROMORPHONE HCL 1 MG/ML IJ SOLN: 0.2500 mg | INTRAMUSCULAR | Status: DC | PRN

## 2020-01-30 MED ORDER — PROMETHAZINE HCL 25 MG/ML IJ SOLN: 6.2500 mg | INTRAMUSCULAR | Status: DC | PRN

## 2020-01-30 MED ORDER — MIDAZOLAM HCL 2 MG/2ML IJ SOLN
INTRAMUSCULAR | Status: AC
  Filled 2020-01-30: qty 2

## 2020-01-30 MED ORDER — OXYMETAZOLINE HCL 0.05 % NA SOLN
NASAL | Status: AC
  Filled 2020-01-30: qty 30

## 2020-01-30 MED ORDER — MIDAZOLAM HCL 5 MG/5ML IJ SOLN
INTRAMUSCULAR | Status: DC | PRN
  Administered 2020-01-30: 2 mg via INTRAVENOUS

## 2020-01-30 MED ORDER — METHYLENE BLUE 0.5 % INJ SOLN
INTRAVENOUS | Status: AC
  Filled 2020-01-30: qty 10

## 2020-01-30 MED ORDER — OXYCODONE HCL 5 MG PO TABS
5.0000 mg | ORAL_TABLET | Freq: Once | ORAL | Status: AC | PRN
  Administered 2020-01-30: 5 mg via ORAL

## 2020-01-30 MED ORDER — AMISULPRIDE (ANTIEMETIC) 5 MG/2ML IV SOLN: 10.0000 mg | Freq: Once | INTRAVENOUS | Status: DC | PRN

## 2020-01-30 MED ORDER — MUPIROCIN 2 % EX OINT
TOPICAL_OINTMENT | CUTANEOUS | Status: AC
  Filled 2020-01-30: qty 22

## 2020-01-30 MED ORDER — ROCURONIUM BROMIDE 100 MG/10ML IV SOLN
INTRAVENOUS | Status: DC | PRN
  Administered 2020-01-30: 30 mg via INTRAVENOUS
  Administered 2020-01-30: 80 mg via INTRAVENOUS

## 2020-01-30 MED ORDER — MUPIROCIN 2 % EX OINT
TOPICAL_OINTMENT | CUTANEOUS | Status: DC | PRN
  Administered 2020-01-30: 1 via TOPICAL

## 2020-01-30 MED ORDER — MEPERIDINE HCL 25 MG/ML IJ SOLN: 6.2500 mg | INTRAMUSCULAR | Status: DC | PRN

## 2020-01-30 MED ORDER — ROCURONIUM BROMIDE 10 MG/ML (PF) SYRINGE
PREFILLED_SYRINGE | INTRAVENOUS | Status: AC
  Filled 2020-01-30: qty 10

## 2020-01-30 MED ORDER — EPINEPHRINE PF 1 MG/ML IJ SOLN
INTRAMUSCULAR | Status: AC
  Filled 2020-01-30: qty 2

## 2020-01-30 MED ORDER — ONDANSETRON HCL 4 MG/2ML IJ SOLN
INTRAMUSCULAR | Status: DC | PRN
  Administered 2020-01-30: 4 mg via INTRAVENOUS

## 2020-01-30 MED ORDER — LIDOCAINE HCL (CARDIAC) PF 100 MG/5ML IV SOSY
PREFILLED_SYRINGE | INTRAVENOUS | Status: DC | PRN
  Administered 2020-01-30: 100 mg via INTRAVENOUS

## 2020-01-30 MED ORDER — PROPOFOL 10 MG/ML IV BOLUS
INTRAVENOUS | Status: AC
  Filled 2020-01-30: qty 40

## 2020-01-30 MED ORDER — SUGAMMADEX SODIUM 200 MG/2ML IV SOLN
INTRAVENOUS | Status: DC | PRN
Start: 2020-01-30 — End: 2020-01-30
  Administered 2020-01-30: 400 mg via INTRAVENOUS

## 2020-01-30 MED ORDER — FENTANYL CITRATE (PF) 100 MCG/2ML IJ SOLN
INTRAMUSCULAR | Status: DC | PRN
  Administered 2020-01-30 (×2): 25 ug via INTRAVENOUS
  Administered 2020-01-30: 100 ug via INTRAVENOUS
  Administered 2020-01-30: 50 ug via INTRAVENOUS

## 2020-01-30 MED ORDER — LIDOCAINE 2% (20 MG/ML) 5 ML SYRINGE
INTRAMUSCULAR | Status: AC
  Filled 2020-01-30: qty 5

## 2020-01-30 MED ORDER — CEFAZOLIN SODIUM-DEXTROSE 2-4 GM/100ML-% IV SOLN
INTRAVENOUS | Status: AC
  Filled 2020-01-30: qty 100

## 2020-01-30 MED ORDER — OXYCODONE HCL 5 MG/5ML PO SOLN: 5.0000 mg | Freq: Once | ORAL | Status: AC | PRN

## 2020-01-30 MED ORDER — LABETALOL HCL 5 MG/ML IV SOLN
5.0000 mg | INTRAVENOUS | Status: AC | PRN
  Administered 2020-01-30 (×3): 5 mg via INTRAVENOUS

## 2020-01-30 MED ORDER — LABETALOL HCL 5 MG/ML IV SOLN
INTRAVENOUS | Status: AC
  Filled 2020-01-30: qty 4

## 2020-01-30 SURGICAL SUPPLY — 64 items
AQUAPLAST 3X3 FLAT (MISCELLANEOUS)
ATTRACTOMAT 16X20 MAGNETIC DRP (DRAPES) IMPLANT
BENZOIN TINCTURE PRP APPL 2/3 (GAUZE/BANDAGES/DRESSINGS) ×4 IMPLANT
BLADE EAR TYMPAN 2.5 60D BEAV (BLADE) ×4 IMPLANT
BLADE INF TURB ROT M4 2 5PK (BLADE) ×3 IMPLANT
BLADE INF TURB ROT M4 2MM 5PK (BLADE) ×1
BLADE MINI RND TIP GREEN BEAV (BLADE) ×4 IMPLANT
BLADE SURG 11 STRL SS (BLADE) IMPLANT
BLADE SURG 15 STRL LF DISP TIS (BLADE) ×2 IMPLANT
BLADE SURG 15 STRL SS (BLADE) ×4
CANISTER SUCT 1200ML W/VALVE (MISCELLANEOUS) ×4 IMPLANT
CLOSURE WOUND 1/2 X4 (GAUZE/BANDAGES/DRESSINGS) ×1
COAGULATOR SUCT 8FR VV (MISCELLANEOUS) ×4 IMPLANT
COVER WAND RF STERILE (DRAPES) IMPLANT
DECANTER SPIKE VIAL GLASS SM (MISCELLANEOUS) IMPLANT
DRSG TELFA 3X8 NADH (GAUZE/BANDAGES/DRESSINGS) ×4 IMPLANT
ELECT NEEDLE TIP 2.8 STRL (NEEDLE) IMPLANT
ELECT REM PT RETURN 9FT ADLT (ELECTROSURGICAL) ×4
ELECTRODE REM PT RTRN 9FT ADLT (ELECTROSURGICAL) ×2 IMPLANT
GLOVE BIO SURGEON STRL SZ 6.5 (GLOVE) ×6 IMPLANT
GLOVE BIO SURGEONS STRL SZ 6.5 (GLOVE) ×2
GLOVE BIOGEL M 6.5 STRL (GLOVE) ×8 IMPLANT
GLOVE BIOGEL PI IND STRL 7.0 (GLOVE) ×8 IMPLANT
GLOVE BIOGEL PI INDICATOR 7.0 (GLOVE) ×8
GLOVE ECLIPSE 6.5 STRL STRAW (GLOVE) ×4 IMPLANT
GLOVE SS BIOGEL STRL SZ 7.5 (GLOVE) ×2 IMPLANT
GLOVE SUPERSENSE BIOGEL SZ 7.5 (GLOVE) ×2
GOWN STRL REUS W/ TWL LRG LVL3 (GOWN DISPOSABLE) ×12 IMPLANT
GOWN STRL REUS W/TWL LRG LVL3 (GOWN DISPOSABLE) ×24
IV NS 500ML (IV SOLUTION) ×4
IV NS 500ML BAXH (IV SOLUTION) ×2 IMPLANT
NEEDLE KEITH (NEEDLE) IMPLANT
NEEDLE PRECISIONGLIDE 27X1.5 (NEEDLE) ×4 IMPLANT
NS IRRIG 1000ML POUR BTL (IV SOLUTION) ×4 IMPLANT
PACK BASIN DAY SURGERY FS (CUSTOM PROCEDURE TRAY) ×4 IMPLANT
PACK ENT DAY SURGERY (CUSTOM PROCEDURE TRAY) ×4 IMPLANT
PATTIES SURGICAL .5 X3 (DISPOSABLE) ×4 IMPLANT
PENCIL SMOKE EVACUATOR (MISCELLANEOUS) IMPLANT
SHEET SILICONE 2X3 0.02 REINF (MISCELLANEOUS) IMPLANT
SHEET SILICONE 2X3 0.03 REINF (MISCELLANEOUS) IMPLANT
SLEEVE SCD COMPRESS KNEE MED (MISCELLANEOUS) ×4 IMPLANT
SPLINT AQUAPLAST 3X3 FLAT (MISCELLANEOUS) IMPLANT
SPLINT NASAL AIRWAY SILICONE (MISCELLANEOUS) IMPLANT
SPLINT NASAL THERMO PLAST (MISCELLANEOUS) ×4 IMPLANT
SPONGE GAUZE 2X2 8PLY STER LF (GAUZE/BANDAGES/DRESSINGS) ×1
SPONGE GAUZE 2X2 8PLY STRL LF (GAUZE/BANDAGES/DRESSINGS) ×3 IMPLANT
STRIP CLOSURE SKIN 1/2X4 (GAUZE/BANDAGES/DRESSINGS) ×3 IMPLANT
SUT CHROMIC 3 0 PS 2 (SUTURE) ×4 IMPLANT
SUT CHROMIC 4 0 P 3 18 (SUTURE) ×4 IMPLANT
SUT CHROMIC 4 0 PS 2 18 (SUTURE) ×4 IMPLANT
SUT CHROMIC 5 0 P 3 (SUTURE) IMPLANT
SUT CHROMIC 6 0 G 1 (SUTURE) IMPLANT
SUT ETHILON 3 0 PS 1 (SUTURE) ×4 IMPLANT
SUT ETHILON 4 0 PS 2 18 (SUTURE) IMPLANT
SUT ETHILON 6 0 P 1 (SUTURE) IMPLANT
SUT SILK 2 0 PERMA HAND 18 BK (SUTURE) ×4 IMPLANT
SUT VIC AB 4-0 P-3 18XBRD (SUTURE) IMPLANT
SUT VIC AB 4-0 P3 18 (SUTURE)
SYR 3ML 18GX1 1/2 (SYRINGE) IMPLANT
TOWEL GREEN STERILE FF (TOWEL DISPOSABLE) ×4 IMPLANT
TRAY DSU PREP LF (CUSTOM PROCEDURE TRAY) ×4 IMPLANT
TUBE CONNECTING 20'X1/4 (TUBING) ×1
TUBE CONNECTING 20X1/4 (TUBING) ×3 IMPLANT
YANKAUER SUCT BULB TIP NO VENT (SUCTIONS) ×4 IMPLANT

## 2020-01-30 NOTE — Transfer of Care (Signed)
Immediate Anesthesia Transfer of Care Note  Patient: Jackson Donaldson  Procedure(s) Performed: NASAL SEPTOPLASTY WITH TURBINATE REDUCTION (Bilateral Nose) RHINOPLASTY (N/A Nose)  Patient Location: PACU  Anesthesia Type:General  Level of Consciousness: awake, alert  and oriented  Airway & Oxygen Therapy: Patient Spontanous Breathing and Patient connected to face mask oxygen  Post-op Assessment: Report given to RN and Post -op Vital signs reviewed and stable  Post vital signs: Reviewed and stable  Last Vitals:  Vitals Value Taken Time  BP 190/121 01/30/20 1039  Temp    Pulse 91 01/30/20 1040  Resp 17 01/30/20 1040  SpO2 100 % 01/30/20 1040  Vitals shown include unvalidated device data.  Last Pain:  Vitals:   01/30/20 0657  TempSrc: Oral  PainSc: 0-No pain         Complications: No complications documented.

## 2020-01-30 NOTE — Interval H&P Note (Signed)
History and Physical Interval Note:  01/30/2020 10:52 AM  Jackson Donaldson  has presented today for surgery, with the diagnosis of NASAL HYPERTROPHY, NASAL OBSTRUCTION, DEVIATED SEPTUM.  The various methods of treatment have been discussed with the patient and family. After consideration of risks, benefits and other options for treatment, the patient has consented to  Procedure(s): NASAL SEPTOPLASTY WITH TURBINATE REDUCTION (Bilateral) RHINOPLASTY (N/A) as a surgical intervention.  The patient's history has been reviewed, patient examined, no change in status, stable for surgery.  I have reviewed the patient's chart and labs.  Questions were answered to the patient's satisfaction.     [name]

## 2020-01-30 NOTE — Discharge Instructions (Signed)
Elevate head of bed and apply cool compress to nose to reduce swelling and bleeding Keflex 500 mg twice per day for the next 10 days Tylenol, ibuprofen or hydrocodone 5 mg tabs 1-2 every 6 hrs prn pain Return to Dr Allene Pyo office tomorrow at 11:45 to have nasal packing removed   Post Anesthesia Home Care Instructions  Activity: Get plenty of rest for the remainder of the day. A responsible individual must stay with you for 24 hours following the procedure.  For the next 24 hours, DO NOT: -Drive a car -Advertising copywriter -Drink alcoholic beverages -Take any medication unless instructed by your physician -Make any legal decisions or sign important papers.  Meals: Start with liquid foods such as gelatin or soup. Progress to regular foods as tolerated. Avoid greasy, spicy, heavy foods. If nausea and/or vomiting occur, drink only clear liquids until the nausea and/or vomiting subsides. Call your physician if vomiting continues.  Special Instructions/Symptoms: Your throat may feel dry or sore from the anesthesia or the breathing tube placed in your throat during surgery. If this causes discomfort, gargle with warm salt water. The discomfort should disappear within 24 hours.  Call your surgeon if you experience:   1.  Fever over 101.0. 2.  Inability to urinate. 3.  Nausea and/or vomiting. 4.  Extreme swelling or bruising at the surgical site. 5.  Continued bleeding from the incision. 6.  Increased pain, redness or drainage from the incision. 7.  Problems related to your pain medication. 8.  Any problems and/or concerns

## 2020-01-30 NOTE — Op Note (Signed)
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  BRIEF CLINICAL NOTE:  The patient states he has always had trouble breathing through the left side of his nose and as he has gotten older, it has gradually gotten worse.  He denies any trauma to the nose or nasal fractures in the past, but has a fairly  significant deviation of the nasal dorsum to the left, as well as a severe septal deformity to the left with the septum basically touching the left lateral nasal wall.  He is taken to the operating room at this time for septoplasty, turbinate reductions  and rhinoplasty or osteotomies.  SURGEON:  [name], MD  ANESTHESIA:  General endotracheal.  ESTIMATED BLOOD LOSS:  Minimal.  COMPLICATIONS:  None.  DESCRIPTION OF PROCEDURE:  After adequate endotracheal anesthesia, the patient received 2 g of Ancef IV preoperatively.  Nose was prepped with a Betadine solution and draped in sterile towels.  The nose was then further prepped with a cotton pledget  soaked in Afrin and the septum and nose was injected with Xylocaine with epinephrine for hemostasis.  On examination of the nose, the patient had a very severe deviation of the septum to the left.  A hemitransfixion incision was made along the cartilage  of the septum on the right side.  Mucoperichondrial and mucoperiosteal flaps were elevated posteriorly.  Because of the severity of the cartilaginous deformity, mucoperichondrial flaps were elevated on either side of the septum anteriorly.  The cartilage  was so deformed, it could not shaved or corrected without removing a portion of the cartilage.  So a portion of cartilage had  to be removed and was saved for later reinsertion.  More posteriorly on the left side, the patient had a very large left septal  spur protruding to the left inferior turbinate.  The septal spur was removed posteriorly.  Prior to closing the septum, because of the external bony deformity to the left, osteotomies were performed.  A small intercartilaginous incision was made between  the upper lateral cartilage and the lower lateral cartilage and a submucosal tunnel was made up over to the bony dorsum.  Then using a guarded straight osteotome, medial osteotomies were performed on either side of the bone on each side of the septum.   Then, a small incision was made in the lateral portion of the nose on either side and using the guarded curved osteotome, lateral osteotomies were performed in order to break and move the bones back more to midline.  After performing the fractures, the  bones were able to be moved.  The left nasal bone was fractured to the right and the right nasal bone was fractured more laterally.  This completed the osteotomies.  Next, the septum was moved back toward midline and 2 separate small pieces of cartilage  measuring approximately 1 cm and one about 2 cm in length were placed anteriorly to recreate the anterior cartilaginous septum.  The superior portion of the cartilaginous septum was preserved.  These were secured with 4-0 chromic suture to the previous  preserved cartilage and then the septal flaps were placed down and the septum was basted with a 4-0 chromic suture and then a 3-0 chromic suture.   The hemitransfixion  incision was closed with interrupted 4-0 chromic suture and the intercartilaginous  incision was closed with interrupted chromic suture.  Splints were secured to either side of the septum with a 3-0 nylon suture.  The nose was then packed with Telfa soaked in mupirocin ointment on either side of the nose.  Steri-Strips and a thermoplast  cast were placed to the nasal  dorsum.  This completed the procedure.  The patient was awoken from anesthesia and transferred to recovery room, postop doing well.  DISPOSITION:  The patient is discharged home on Keflex 500 mg b.i.d. for 10 days and Tylenol, ibuprofen and hydrocodone tablets 1-2 every 6 hours p.r.n. pain for the next couple of days.  He will follow up in my office tomorrow to have his nasal packs  removed.  VN/NUANCE  D:01/30/2020 T:01/30/2020 JOB:[id number]

## 2020-01-30 NOTE — Anesthesia Preprocedure Evaluation (Addendum)
Anesthesia Evaluation  Patient identified by MRN, date of birth, ID band Patient awake    Reviewed: Allergy & Precautions, NPO status , Patient's Chart, lab work & pertinent test results  Airway Mallampati: II  TM Distance: >3 FB Neck ROM: Full    Dental  (+) Poor Dentition   Pulmonary neg pulmonary ROS,    Pulmonary exam normal breath sounds clear to auscultation       Cardiovascular hypertension, negative cardio ROS Normal cardiovascular exam Rhythm:Regular Rate:Normal     Neuro/Psych negative neurological ROS  negative psych ROS   GI/Hepatic Neg liver ROS, GERD  ,  Endo/Other  negative endocrine ROS  Renal/GU Renal InsufficiencyRenal disease  negative genitourinary   Musculoskeletal negative musculoskeletal ROS (+)   Abdominal   Peds negative pediatric ROS (+)  Hematology negative hematology ROS (+)   Anesthesia Other Findings   Reproductive/Obstetrics negative OB ROS                            Anesthesia Physical Anesthesia Plan  ASA: II  Anesthesia Plan: General   Post-op Pain Management:    Induction: Intravenous  PONV Risk Score and Plan: 2 and Ondansetron, Midazolam and Treatment may vary due to age or medical condition  Airway Management Planned: Oral ETT  Additional Equipment:   Intra-op Plan:   Post-operative Plan: Extubation in OR  Informed Consent: I have reviewed the patients History and Physical, chart, labs and discussed the procedure including the risks, benefits and alternatives for the proposed anesthesia with the patient or authorized representative who has indicated his/her understanding and acceptance.     Dental advisory given  Plan Discussed with: CRNA  Anesthesia Plan Comments:         Anesthesia Quick Evaluation

## 2020-01-30 NOTE — Anesthesia Postprocedure Evaluation (Signed)
Anesthesia Post Note  Patient: Jackson Donaldson  Procedure(s) Performed: NASAL SEPTOPLASTY WITH TURBINATE REDUCTION (Bilateral Nose) RHINOPLASTY (N/A Nose)     Patient location during evaluation: PACU Anesthesia Type: General Level of consciousness: awake and alert Pain management: pain level controlled Vital Signs Assessment: post-procedure vital signs reviewed and stable Respiratory status: spontaneous breathing, nonlabored ventilation, respiratory function stable and patient connected to nasal cannula oxygen Cardiovascular status: blood pressure returned to baseline and stable Postop Assessment: no apparent nausea or vomiting Anesthetic complications: no Comments: BP treated in PACU, patient will resume home meds this evening. Pt also to follow up with PCP to ensure adequate BP management.    No complications documented.  Last Vitals:  Vitals:   01/30/20 1130 01/30/20 1230  BP: (!) 182/99 (!) 195/99  Pulse: 75 77  Resp: 13 20  Temp:  37.2 C  SpO2: 97% 100%    Last Pain:  Vitals:   01/30/20 1230  TempSrc: Oral  PainSc: 3                  Shelton Silvas

## 2020-01-30 NOTE — Anesthesia Procedure Notes (Signed)
Procedure Name: Intubation Performed by: Verita Lamb, CRNA Pre-anesthesia Checklist: Patient identified, Emergency Drugs available, Suction available and Patient being monitored Patient Re-evaluated:Patient Re-evaluated prior to induction Oxygen Delivery Method: Circle system utilized Preoxygenation: Pre-oxygenation with 100% oxygen Induction Type: IV induction Ventilation: Mask ventilation without difficulty Laryngoscope Size: Mac and 4 Grade View: Grade I Tube type: Oral Number of attempts: 1 Airway Equipment and Method: Stylet and Oral airway Placement Confirmation: ETT inserted through vocal cords under direct vision,  positive ETCO2,  breath sounds checked- equal and bilateral and CO2 detector Secured at: 23 cm Tube secured with: Tape Dental Injury: Teeth and Oropharynx as per pre-operative assessment

## 2020-01-30 NOTE — Brief Op Note (Signed)
01/30/2020  10:41 AM  PATIENT:  Jackson Donaldson  59 y.o. male  PRE-OPERATIVE DIAGNOSIS:  NASAL HYPERTROPHY, NASAL OBSTRUCTION, DEVIATED SEPTUM  POST-OPERATIVE DIAGNOSIS:  NASAL HYPERTROPHY, NASAL OBSTRUCTION, DEVIATED SEPTUM  PROCEDURE:  Procedure(s): NASAL SEPTOPLASTY WITH TURBINATE REDUCTION (Bilateral) RHINOPLASTY (N/A)  SURGEON:  Surgeon(s) and Role:    Drema Halon, MD - Primary  PHYSICIAN ASSISTANT:   ASSISTANTS: none   ANESTHESIA:   general  EBL:  100 mL   BLOOD ADMINISTERED:none  DRAINS: none   LOCAL MEDICATIONS USED:  XYLOCAINE   SPECIMEN:  No Specimen  DISPOSITION OF SPECIMEN:  N/A  COUNTS:  YES  TOURNIQUET:  * No tourniquets in log *  DICTATION: .Other Dictation: Dictation Number [id number]  PLAN OF CARE: Discharge to home after PACU  PATIENT DISPOSITION:  PACU - hemodynamically stable.   Delay start of Pharmacological VTE agent (>24hrs) due to surgical blood loss or risk of bleeding: yes

## 2020-01-31 ENCOUNTER — Ambulatory Visit (INDEPENDENT_AMBULATORY_CARE_PROVIDER_SITE_OTHER): Payer: 59 | Admitting: Otolaryngology

## 2020-01-31 ENCOUNTER — Encounter (INDEPENDENT_AMBULATORY_CARE_PROVIDER_SITE_OTHER): Payer: Self-pay | Admitting: Otolaryngology

## 2020-01-31 VITALS — Temp 97.9°F

## 2020-01-31 DIAGNOSIS — Z4889 Encounter for other specified surgical aftercare: Secondary | ICD-10-CM

## 2020-01-31 NOTE — Progress Notes (Signed)
HPI: Jackson Donaldson is a 59 y.o. male who presents 1 days s/p septoplasty and turbinate reductions as well as osteotomies to straighten the nose.Marland Kitchen   Past Medical History:  Diagnosis Date  . Anemia   . GERD (gastroesophageal reflux disease)   . Hypertension   . Iron deficiency anemia   . Kidney stones    Past Surgical History:  Procedure Laterality Date  . no past surgeries     Social History   Socioeconomic History  . Marital status: Divorced    Spouse name: Not on file  . Number of children: 2  . Years of education: Not on file  . Highest education level: Not on file  Occupational History  . Occupation: Auto-tech-owner  Tobacco Use  . Smoking status: Never Smoker  . Smokeless tobacco: Never Used  Vaping Use  . Vaping Use: Never used  Substance and Sexual Activity  . Alcohol use: Yes    Alcohol/week: 12.0 - 20.0 standard drinks    Types: 12 - 20 Cans of beer per week    Comment: "on the weekend"  . Drug use: No  . Sexual activity: Not on file  Other Topics Concern  . Not on file  Social History Narrative  . Not on file   Social Determinants of Health   Financial Resource Strain:   . Difficulty of Paying Living Expenses:   Food Insecurity:   . Worried About Programme researcher, broadcasting/film/video in the Last Year:   . Barista in the Last Year:   Transportation Needs:   . Freight forwarder (Medical):   Marland Kitchen Lack of Transportation (Non-Medical):   Physical Activity:   . Days of Exercise per Week:   . Minutes of Exercise per Session:   Stress:   . Feeling of Stress :   Social Connections:   . Frequency of Communication with Friends and Family:   . Frequency of Social Gatherings with Friends and Family:   . Attends Religious Services:   . Active Member of Clubs or Organizations:   . Attends Banker Meetings:   Marland Kitchen Marital Status:    Family History  Problem Relation Age of Onset  . Diabetes Mother   . Breast cancer Sister   . Lung cancer Sister   .  Colon cancer Neg Hx   . Esophageal cancer Neg Hx   . Rectal cancer Neg Hx    No Known Allergies Prior to Admission medications   Medication Sig Start Date End Date Taking? Authorizing Provider  cephALEXin (KEFLEX) 500 MG capsule Take 1 capsule (500 mg total) by mouth 2 (two) times daily. 01/30/20  Yes Drema Halon, MD  ferrous sulfate 325 (65 FE) MG EC tablet Take 325 mg by mouth 3 (three) times daily with meals.   Yes [provider]  HYDROcodone-acetaminophen (NORCO/VICODIN) 5-325 MG tablet Take 1-2 tablets by mouth every 6 (six) hours as needed for moderate pain. 01/30/20  Yes Drema Halon, MD  lisinopril-hydrochlorothiazide (ZESTORETIC) 10-12.5 MG tablet TAKE 1 TABLET BY MOUTH EVERY DAY 01/30/20  Yes Henson, Vickie L, NP-C  methocarbamol (ROBAXIN) 500 MG tablet Take 1 tablet (500 mg total) by mouth 4 (four) times daily. 01/03/20  Yes Henson, Vickie L, NP-C  omeprazole (PRILOSEC) 20 MG capsule Take 20 mg by mouth daily.   Yes [provider]     Physical Exam: Nasal packs were removed in the office today splints are intact.  Dorsal cast is still intact.  Assessment: S/p septoplasty and turbinate reductions and osteotomies to straighten the nose.  Plan: He will follow-up in 1 week to have his septal splints removed. Recommended use of saline irrigations daily. He will continue with antibiotics. He asked for some more pain medication and gave him 6 more tablets of hydrocodone.   Narda Bonds, MD

## 2020-02-01 ENCOUNTER — Encounter (HOSPITAL_BASED_OUTPATIENT_CLINIC_OR_DEPARTMENT_OTHER): Payer: Self-pay | Admitting: Otolaryngology

## 2020-02-07 ENCOUNTER — Encounter (INDEPENDENT_AMBULATORY_CARE_PROVIDER_SITE_OTHER): Payer: Self-pay | Admitting: Otolaryngology

## 2020-02-07 ENCOUNTER — Other Ambulatory Visit: Payer: Self-pay

## 2020-02-07 ENCOUNTER — Ambulatory Visit (INDEPENDENT_AMBULATORY_CARE_PROVIDER_SITE_OTHER): Payer: 59 | Admitting: Otolaryngology

## 2020-02-07 VITALS — Temp 96.1°F

## 2020-02-07 DIAGNOSIS — Z4889 Encounter for other specified surgical aftercare: Secondary | ICD-10-CM

## 2020-02-07 NOTE — Progress Notes (Signed)
HPI: Jackson Donaldson is a 59 y.o. male who presents 8 days s/p septorhinoplasty and turbinate reductions for nasal obstruction.  He presents today to have his septal splints removed.Marland Kitchen He does remind me that he has chronic tinnitus that he has had for years but is gradually gotten worse.  He has not had a hearing test .  Past Medical History:  Diagnosis Date  . Anemia   . GERD (gastroesophageal reflux disease)   . Hypertension   . Iron deficiency anemia   . Kidney stones    Past Surgical History:  Procedure Laterality Date  . NASAL SEPTOPLASTY W/ TURBINOPLASTY Bilateral 01/30/2020   Procedure: NASAL SEPTOPLASTY WITH TURBINATE REDUCTION;  Surgeon: Drema Halon, MD;  Location: [facility];  Service: ENT;  Laterality: Bilateral;  . no past surgeries    . RHINOPLASTY N/A 01/30/2020   Procedure: RHINOPLASTY;  Surgeon: Drema Halon, MD;  Location: [facility];  Service: ENT;  Laterality: N/A;   Social History   Socioeconomic History  . Marital status: Divorced    Spouse name: Not on file  . Number of children: 2  . Years of education: Not on file  . Highest education level: Not on file  Occupational History  . Occupation: Auto-tech-owner  Tobacco Use  . Smoking status: Never Smoker  . Smokeless tobacco: Never Used  Vaping Use  . Vaping Use: Never used  Substance and Sexual Activity  . Alcohol use: Yes    Alcohol/week: 12.0 - 20.0 standard drinks    Types: 12 - 20 Cans of beer per week    Comment: "on the weekend"  . Drug use: No  . Sexual activity: Not on file  Other Topics Concern  . Not on file  Social History Narrative  . Not on file   Social Determinants of Health   Financial Resource Strain:   . Difficulty of Paying Living Expenses:   Food Insecurity:   . Worried About Programme researcher, broadcasting/film/video in the Last Year:   . Barista in the Last Year:   Transportation Needs:   . Freight forwarder (Medical):   Marland Kitchen Lack of  Transportation (Non-Medical):   Physical Activity:   . Days of Exercise per Week:   . Minutes of Exercise per Session:   Stress:   . Feeling of Stress :   Social Connections:   . Frequency of Communication with Friends and Family:   . Frequency of Social Gatherings with Friends and Family:   . Attends Religious Services:   . Active Member of Clubs or Organizations:   . Attends Banker Meetings:   Marland Kitchen Marital Status:    Family History  Problem Relation Age of Onset  . Diabetes Mother   . Breast cancer Sister   . Lung cancer Sister   . Colon cancer Neg Hx   . Esophageal cancer Neg Hx   . Rectal cancer Neg Hx    No Known Allergies Prior to Admission medications   Medication Sig Start Date End Date Taking? Authorizing Provider  cephALEXin (KEFLEX) 500 MG capsule Take 1 capsule (500 mg total) by mouth 2 (two) times daily. 01/30/20   Drema Halon, MD  ferrous sulfate 325 (65 FE) MG EC tablet Take 325 mg by mouth 3 (three) times daily with meals.    [provider]  HYDROcodone-acetaminophen (NORCO/VICODIN) 5-325 MG tablet Take 1-2 tablets by mouth every 6 (six) hours as needed for moderate  pain. 01/30/20   Drema Halon, MD  lisinopril-hydrochlorothiazide (ZESTORETIC) 10-12.5 MG tablet TAKE 1 TABLET BY MOUTH EVERY DAY 01/30/20   Henson, Vickie L, NP-C  methocarbamol (ROBAXIN) 500 MG tablet Take 1 tablet (500 mg total) by mouth 4 (four) times daily. 01/03/20   Henson, Vickie L, NP-C  omeprazole (PRILOSEC) 20 MG capsule Take 20 mg by mouth daily.    [provider]     Physical Exam: Septal splints were removed in the office today.  He had minimal crusting buildup.  Nasal passages were otherwise clear.   Assessment: S/p septorhinoplasty and turbinate reductions.  Plan: Instructed to continue with the saline irrigations daily and will follow up in 2 to 3 weeks for recheck.  He will obtain a audiogram and bring that with him on next visit so I  can review this concerning his tinnitus.   Narda Bonds, MD

## 2020-02-26 ENCOUNTER — Other Ambulatory Visit: Payer: Self-pay

## 2020-02-26 ENCOUNTER — Ambulatory Visit (INDEPENDENT_AMBULATORY_CARE_PROVIDER_SITE_OTHER): Payer: 59 | Admitting: Otolaryngology

## 2020-02-26 DIAGNOSIS — Z4889 Encounter for other specified surgical aftercare: Secondary | ICD-10-CM

## 2020-02-26 NOTE — Progress Notes (Signed)
HPI: Jackson Donaldson is a 59 y.o. male who presents 28 days s/p septoplasty turbinate reductions and limited rhinoplasty.  He is breathing better through his nose.Marland Kitchen   Past Medical History:  Diagnosis Date  . Anemia   . GERD (gastroesophageal reflux disease)   . Hypertension   . Iron deficiency anemia   . Kidney stones    Past Surgical History:  Procedure Laterality Date  . NASAL SEPTOPLASTY W/ TURBINOPLASTY Bilateral 01/30/2020   Procedure: NASAL SEPTOPLASTY WITH TURBINATE REDUCTION;  Surgeon: Drema Halon, MD;  Location: [facility];  Service: ENT;  Laterality: Bilateral;  . no past surgeries    . RHINOPLASTY N/A 01/30/2020   Procedure: RHINOPLASTY;  Surgeon: Drema Halon, MD;  Location: [facility];  Service: ENT;  Laterality: N/A;   Social History   Socioeconomic History  . Marital status: Divorced    Spouse name: Not on file  . Number of children: 2  . Years of education: Not on file  . Highest education level: Not on file  Occupational History  . Occupation: Auto-tech-owner  Tobacco Use  . Smoking status: Never Smoker  . Smokeless tobacco: Never Used  Vaping Use  . Vaping Use: Never used  Substance and Sexual Activity  . Alcohol use: Yes    Alcohol/week: 12.0 - 20.0 standard drinks    Types: 12 - 20 Cans of beer per week    Comment: "on the weekend"  . Drug use: No  . Sexual activity: Not on file  Other Topics Concern  . Not on file  Social History Narrative  . Not on file   Social Determinants of Health   Financial Resource Strain:   . Difficulty of Paying Living Expenses:   Food Insecurity:   . Worried About Programme researcher, broadcasting/film/video in the Last Year:   . Barista in the Last Year:   Transportation Needs:   . Freight forwarder (Medical):   Marland Kitchen Lack of Transportation (Non-Medical):   Physical Activity:   . Days of Exercise per Week:   . Minutes of Exercise per Session:   Stress:   . Feeling of Stress :    Social Connections:   . Frequency of Communication with Friends and Family:   . Frequency of Social Gatherings with Friends and Family:   . Attends Religious Services:   . Active Member of Clubs or Organizations:   . Attends Banker Meetings:   Marland Kitchen Marital Status:    Family History  Problem Relation Age of Onset  . Diabetes Mother   . Breast cancer Sister   . Lung cancer Sister   . Colon cancer Neg Hx   . Esophageal cancer Neg Hx   . Rectal cancer Neg Hx    No Known Allergies Prior to Admission medications   Medication Sig Start Date End Date Taking? Authorizing Provider  cephALEXin (KEFLEX) 500 MG capsule Take 1 capsule (500 mg total) by mouth 2 (two) times daily. 01/30/20   Drema Halon, MD  ferrous sulfate 325 (65 FE) MG EC tablet Take 325 mg by mouth 3 (three) times daily with meals.    [provider]  HYDROcodone-acetaminophen (NORCO/VICODIN) 5-325 MG tablet Take 1-2 tablets by mouth every 6 (six) hours as needed for moderate pain. 01/30/20   Drema Halon, MD  lisinopril-hydrochlorothiazide (ZESTORETIC) 10-12.5 MG tablet TAKE 1 TABLET BY MOUTH EVERY DAY 01/30/20   Henson, Vickie L, NP-C  methocarbamol (ROBAXIN)  500 MG tablet Take 1 tablet (500 mg total) by mouth 4 (four) times daily. 01/03/20   Henson, Vickie L, NP-C  omeprazole (PRILOSEC) 20 MG capsule Take 20 mg by mouth daily.    [provider]     Physical Exam: Septum relatively midline with clear nasal passages bilaterally with minimal crusting.   Assessment: S/p septorhinoplasty with turbinate reductions 4 weeks ago.  Plan: He is breathing better. He has history of obstructive sleep apnea and I feel like it is okay for him to go in and get fitted for the CPAP machine in 3 to 4 weeks.   Narda Bonds, MD

## 2020-03-15 ENCOUNTER — Telehealth: Payer: Self-pay | Admitting: Family Medicine

## 2020-03-15 NOTE — Telephone Encounter (Signed)
Pt called and states that the dermatologist he was referred to did testing on the rash he has had. He states that he was informed by that provider that the cause of the rash was medicine related. He would like to have his BP meds changed. Pt uses [facility]  [place] and can be reached at [phone].

## 2020-03-16 NOTE — Telephone Encounter (Signed)
He will need a visit for this.

## 2020-03-18 ENCOUNTER — Encounter: Payer: Self-pay | Admitting: Family Medicine

## 2020-03-18 ENCOUNTER — Other Ambulatory Visit: Payer: Self-pay

## 2020-03-18 ENCOUNTER — Ambulatory Visit: Payer: 59 | Admitting: Family Medicine

## 2020-03-18 VITALS — BP 128/78 | HR 78 | Temp 97.9°F | Wt 184.4 lb

## 2020-03-18 DIAGNOSIS — I10 Essential (primary) hypertension: Secondary | ICD-10-CM

## 2020-03-18 DIAGNOSIS — G4733 Obstructive sleep apnea (adult) (pediatric): Secondary | ICD-10-CM | POA: Diagnosis not present

## 2020-03-18 DIAGNOSIS — T50905A Adverse effect of unspecified drugs, medicaments and biological substances, initial encounter: Secondary | ICD-10-CM | POA: Diagnosis not present

## 2020-03-18 MED ORDER — LOSARTAN POTASSIUM 50 MG PO TABS: 50.0000 mg | ORAL_TABLET | Freq: Every day | ORAL | 0 refills | Status: DC

## 2020-03-18 NOTE — Progress Notes (Signed)
   Subjective:    Patient ID: Jackson Donaldson, male    DOB: [birth year], 59 y.o.   MRN: [id number]  HPI Chief Complaint  Patient presents with  . rash    rash since changing bp med . derm thinks he is having reaction to bp med   Here with complaints of a rash related to BP medication.  States his dermatologist at Dr. Scharlene Gloss office did a biopsy and determined his rash was due to his medication.  We will need to stop his current BP medication and start on a new one.   Diagnosed with severe OSA and we have been waiting for his septal surgery and clearance from his ENT in order to proceed with trying CPAP. I will order his CPAP today.   No new concerns.   Denies fever, chills, headache, chest pain, palpitations, shortness of breath, cough, abdominal pain, N/V/D, urinary symptoms, LE edema.    Reviewed allergies, medications, past medical, surgical, family, and social history.    Review of Systems Pertinent positives and negatives in the history of present illness.     Objective:   Physical Exam BP 128/78   Pulse 78   Temp 97.9 F (36.6 C)   Wt 184 lb 6.4 oz (83.6 kg)   BMI 27.23 kg/m   Alert and in no distress. Cardiac exam shows a regular sinus rhythm without murmurs or gallops. Lungs are clear to auscultation. Red, flat rash to bilateral forearms with dryness and scaling. No LE edema.       Assessment & Plan:  Adverse drug interaction with prescription medication -Reports having a biopsy of his rash by dermatology and was advised that his rash was due to medication.  He was taking lisinopril/HCTZ.  We will stop this medication and start him on losartan.  Essential hypertension - Plan: losartan (COZAAR) 50 MG tablet -Encouraged him to keep a closer eye on his blood pressure at home since we are switching his medication.  Eat a low-sodium diet.  He will call me in 2 weeks with blood pressure readings.  I will follow-up with him in the office in 4 to 6 weeks  OSA  (obstructive sleep apnea) - Plan: For home use only DME continuous positive airway pressure (CPAP) -Severe OSA diagnosed via home sleep test.  We have been waiting on clearance from recent surgery to fix deviated septum.  States he has been cleared and can now proceed with getting a CPAP.  I will order a CPAP for him.  He is aware that he will need to return approximately 30 days after starting CPAP use

## 2020-03-18 NOTE — Patient Instructions (Signed)
Stop the lisinopril/HCTZ and start on losartan for your blood pressure.  Check your blood pressure daily for the next couple of weeks.  Our goal is less than 130/80. Eat a low-sodium diet  Call in 2 weeks with your blood pressure readings.  Follow-up in 4 to 6 weeks in the office and bring in your blood pressure machine and your readings. We will also discuss sleep apnea and your CPAP.  You will hear from [facility] regarding your CPAP    DASH Eating Plan DASH stands for "Dietary Approaches to Stop Hypertension." The DASH eating plan is a healthy eating plan that has been shown to reduce high blood pressure (hypertension). It may also reduce your risk for type 2 diabetes, heart disease, and stroke. The DASH eating plan may also help with weight loss. What are tips for following this plan?  General guidelines  Avoid eating more than 2,300 mg (milligrams) of salt (sodium) a day. If you have hypertension, you may need to reduce your sodium intake to 1,500 mg a day.  Limit alcohol intake to no more than 1 drink a day for nonpregnant women and 2 drinks a day for men. One drink equals 12 oz of beer, 5 oz of wine, or 1 oz of hard liquor.  Work with your health care provider to maintain a healthy body weight or to lose weight. Ask what an ideal weight is for you.  Get at least 30 minutes of exercise that causes your heart to beat faster (aerobic exercise) most days of the week. Activities may include walking, swimming, or biking.  Work with your health care provider or diet and nutrition specialist (dietitian) to adjust your eating plan to your individual calorie needs. Reading food labels   Check food labels for the amount of sodium per serving. Choose foods with less than 5 percent of the Daily Value of sodium. Generally, foods with less than 300 mg of sodium per serving fit into this eating plan.  To find whole grains, look for the word "whole" as the first word in the ingredient  list. Shopping  Buy products labeled as "low-sodium" or "no salt added."  Buy fresh foods. Avoid canned foods and premade or frozen meals. Cooking  Avoid adding salt when cooking. Use salt-free seasonings or herbs instead of table salt or sea salt. Check with your health care provider or pharmacist before using salt substitutes.  Do not fry foods. Cook foods using healthy methods such as baking, boiling, grilling, and broiling instead.  Cook with heart-healthy oils, such as olive, canola, soybean, or sunflower oil. Meal planning  Eat a balanced diet that includes: ? 5 or more servings of fruits and vegetables each day. At each meal, try to fill half of your plate with fruits and vegetables. ? Up to 6-8 servings of whole grains each day. ? Less than 6 oz of lean meat, poultry, or fish each day. A 3-oz serving of meat is about the same size as a deck of cards. One egg equals 1 oz. ? 2 servings of low-fat dairy each day. ? A serving of nuts, seeds, or beans 5 times each week. ? Heart-healthy fats. Healthy fats called Omega-3 fatty acids are found in foods such as flaxseeds and coldwater fish, like sardines, salmon, and mackerel.  Limit how much you eat of the following: ? Canned or prepackaged foods. ? Food that is high in trans fat, such as fried foods. ? Food that is high in saturated fat, such as  fatty meat. ? Sweets, desserts, sugary drinks, and other foods with added sugar. ? Full-fat dairy products.  Do not salt foods before eating.  Try to eat at least 2 vegetarian meals each week.  Eat more home-cooked food and less restaurant, buffet, and fast food.  When eating at a restaurant, ask that your food be prepared with less salt or no salt, if possible. What foods are recommended? The items listed may not be a complete list. Talk with your dietitian about what dietary choices are best for you. Grains Whole-grain or whole-wheat bread. Whole-grain or whole-wheat pasta. Brown  rice. [place]. Bulgur. Whole-grain and low-sodium cereals. Pita bread. Low-fat, low-sodium crackers. Whole-wheat flour tortillas. Vegetables Fresh or frozen vegetables (raw, steamed, roasted, or grilled). Low-sodium or reduced-sodium tomato and vegetable juice. Low-sodium or reduced-sodium tomato sauce and tomato paste. Low-sodium or reduced-sodium canned vegetables. Fruits All fresh, dried, or frozen fruit. Canned fruit in natural juice (without added sugar). Meat and other protein foods Skinless chicken or [place]. Ground chicken or [place]. Pork with fat trimmed off. Fish and seafood. Egg whites. Dried beans, peas, or lentils. Unsalted nuts, nut butters, and seeds. Unsalted canned beans. Lean cuts of beef with fat trimmed off. Low-sodium, lean deli meat. Dairy Low-fat (1%) or fat-free (skim) milk. Fat-free, low-fat, or reduced-fat cheeses. Nonfat, low-sodium ricotta or cottage cheese. Low-fat or nonfat yogurt. Low-fat, low-sodium cheese. Fats and oils Soft margarine without trans fats. Vegetable oil. Low-fat, reduced-fat, or light mayonnaise and salad dressings (reduced-sodium). Canola, safflower, olive, soybean, and sunflower oils. Avocado. Seasoning and other foods Herbs. Spices. Seasoning mixes without salt. Unsalted popcorn and pretzels. Fat-free sweets. What foods are not recommended? The items listed may not be a complete list. Talk with your dietitian about what dietary choices are best for you. Grains Baked goods made with fat, such as croissants, muffins, or some breads. Dry pasta or rice meal packs. Vegetables Creamed or fried vegetables. Vegetables in a cheese sauce. Regular canned vegetables (not low-sodium or reduced-sodium). Regular canned tomato sauce and paste (not low-sodium or reduced-sodium). Regular tomato and vegetable juice (not low-sodium or reduced-sodium). Angie Fava. Olives. Fruits Canned fruit in a light or heavy syrup. Fried fruit. Fruit in cream or butter  sauce. Meat and other protein foods Fatty cuts of meat. Ribs. Fried meat. Berniece Salines. Sausage. Bologna and other processed lunch meats. Salami. Fatback. Hotdogs. Bratwurst. Salted nuts and seeds. Canned beans with added salt. Canned or smoked fish. Whole eggs or egg yolks. Chicken or [place] with skin. Dairy Whole or 2% milk, cream, and half-and-half. Whole or full-fat cream cheese. Whole-fat or sweetened yogurt. Full-fat cheese. Nondairy creamers. Whipped toppings. Processed cheese and cheese spreads. Fats and oils Butter. Stick margarine. Lard. Shortening. Ghee. Bacon fat. Tropical oils, such as coconut, palm kernel, or palm oil. Seasoning and other foods Salted popcorn and pretzels. Onion salt, garlic salt, seasoned salt, table salt, and sea salt. Worcestershire sauce. Tartar sauce. Barbecue sauce. Teriyaki sauce. Soy sauce, including reduced-sodium. Steak sauce. Canned and packaged gravies. Fish sauce. Oyster sauce. Cocktail sauce. Horseradish that you find on the shelf. Ketchup. Mustard. Meat flavorings and tenderizers. Bouillon cubes. Hot sauce and Tabasco sauce. Premade or packaged marinades. Premade or packaged taco seasonings. Relishes. Regular salad dressings. Where to find more information:  National Heart, Lung, and [place]: https://wilson-eaton.com/  American Heart Association: www.heart.org Summary  The DASH eating plan is a healthy eating plan that has been shown to reduce high blood pressure (hypertension). It may also reduce your risk for  type 2 diabetes, heart disease, and stroke.  With the DASH eating plan, you should limit salt (sodium) intake to 2,300 mg a day. If you have hypertension, you may need to reduce your sodium intake to 1,500 mg a day.  When on the DASH eating plan, aim to eat more fresh fruits and vegetables, whole grains, lean proteins, low-fat dairy, and heart-healthy fats.  Work with your health care provider or diet and nutrition specialist (dietitian) to adjust  your eating plan to your individual calorie needs. This information is not intended to replace advice given to you by your health care provider. Make sure you discuss any questions you have with your health care provider. Document Revised: 06/25/2017 Document Reviewed: 07/06/2016 Elsevier Patient Education  2020 [employer].

## 2020-03-28 ENCOUNTER — Telehealth: Payer: Self-pay | Admitting: Family Medicine

## 2020-03-28 NOTE — Telephone Encounter (Signed)
Great.  Thanks

## 2020-03-28 NOTE — Telephone Encounter (Signed)
Tiffany with [employer] called and states pt cannot afford the CPAP and will not be moving forward with that.

## 2020-03-28 NOTE — Telephone Encounter (Signed)
I wish we could find assistance for him since he has severe sleep apnea. I am not aware of any financial assistance for this, are you?

## 2020-03-28 NOTE — Telephone Encounter (Signed)
Jackson Donaldson has a new CPAP company.  She will call and see if they have any patient assistance programs as [facility] did not.

## 2020-03-28 NOTE — Telephone Encounter (Signed)
See below

## 2020-03-29 NOTE — Telephone Encounter (Signed)
Called [facility] and they have a package that is [phone] for everything for cpap machine and supplies and pt can do a payment plan so I have faxed over information to them.  Pt is agreeable.

## 2020-04-24 ENCOUNTER — Encounter: Payer: Self-pay | Admitting: Family Medicine

## 2020-06-09 ENCOUNTER — Other Ambulatory Visit: Payer: Self-pay | Admitting: Family Medicine

## 2020-06-09 DIAGNOSIS — I10 Essential (primary) hypertension: Secondary | ICD-10-CM

## 2020-06-14 ENCOUNTER — Telehealth: Payer: Self-pay | Admitting: Family Medicine

## 2020-06-14 DIAGNOSIS — G8929 Other chronic pain: Secondary | ICD-10-CM

## 2020-06-14 DIAGNOSIS — M5440 Lumbago with sciatica, unspecified side: Secondary | ICD-10-CM

## 2020-06-14 MED ORDER — METHOCARBAMOL 500 MG PO TABS: 500.0000 mg | ORAL_TABLET | Freq: Four times a day (QID) | ORAL | 0 refills | Status: DC

## 2020-06-14 NOTE — Telephone Encounter (Signed)
Pt called andis requesting a refill on his robaxin please send to the [id number] - [place], [place] - [address]

## 2020-06-14 NOTE — Telephone Encounter (Signed)
Ok to give Robaxin refill

## 2020-06-14 NOTE — Telephone Encounter (Signed)
Refilled med

## 2020-07-08 ENCOUNTER — Ambulatory Visit: Payer: 59 | Admitting: Medical

## 2020-07-08 ENCOUNTER — Encounter: Payer: Self-pay | Admitting: Medical

## 2020-07-08 ENCOUNTER — Other Ambulatory Visit: Payer: Self-pay

## 2020-07-08 VITALS — BP 162/94 | HR 86 | Ht 69.0 in | Wt 201.0 lb

## 2020-07-08 DIAGNOSIS — I10 Essential (primary) hypertension: Secondary | ICD-10-CM | POA: Insufficient documentation

## 2020-07-08 DIAGNOSIS — M62838 Other muscle spasm: Secondary | ICD-10-CM | POA: Insufficient documentation

## 2020-07-08 DIAGNOSIS — M436 Torticollis: Secondary | ICD-10-CM | POA: Insufficient documentation

## 2020-07-08 DIAGNOSIS — M542 Cervicalgia: Secondary | ICD-10-CM | POA: Diagnosis not present

## 2020-07-08 HISTORY — DX: Essential (primary) hypertension: I10

## 2020-07-08 MED ORDER — NAPROXEN 375 MG PO TBEC: 1.0000 | DELAYED_RELEASE_TABLET | Freq: Two times a day (BID) | ORAL | 0 refills | Status: DC

## 2020-07-08 MED ORDER — CYCLOBENZAPRINE HCL 10 MG PO TABS
10.0000 mg | ORAL_TABLET | Freq: Two times a day (BID) | ORAL | 0 refills | Status: DC | PRN
Start: 2020-07-08 — End: 2020-10-02

## 2020-07-08 NOTE — Progress Notes (Signed)
Subjective:  Jackson Donaldson is a 58 y.o. male who presents for Chief Complaint  Patient presents with  . Neck Pain    Right side x2 weeks worening cramps. Wants referral      Here for 2 wk hx/o right sided neck pain.  No injury, no fall, no trauma.   Likes to 4 wheel but hasn't been doing this due to pain.  Thinks slept wrong and had neck pain.  symptoms got a little better, but didn't resolve.  3-4 days ago awoke and had bad cramp in neck.  No pain in upper back but does have some discomfort in right neck and shoulder area.     Is using muscle relaxer's, typical 1-2 per week.  Used muscle relaxer the last 3-4 days for this but not helping.  Did sleep better last night, but otherwise not helping.  No pain down arm.   No numbers tingling or weakness.    No chest pain, no dyspnea.  Put up Christmas lights few weeks ago.  No prior neck problems.   Used some icy hot pads and tens unit.  HTN - taking BP medication during week but forgets it on weekends.   No other aggravating or relieving factors.    No other c/o.  Past Medical History:  Diagnosis Date  . Anemia   . GERD (gastroesophageal reflux disease)   . Hypertension   . Iron deficiency anemia   . Kidney stones    Current Outpatient Medications on File Prior to Visit  Medication Sig Dispense Refill  . methocarbamol (ROBAXIN) 500 MG tablet Take 1 tablet (500 mg total) by mouth 4 (four) times daily. 30 tablet 0  . ferrous sulfate 325 (65 FE) MG EC tablet Take 325 mg by mouth 3 (three) times daily with meals. (Patient not taking: No sig reported)    . losartan (COZAAR) 50 MG tablet TAKE 1 TABLET BY MOUTH EVERY DAY (Patient not taking: No sig reported) 90 tablet 0  . omeprazole (PRILOSEC) 20 MG capsule Take 20 mg by mouth daily. (Patient not taking: No sig reported)     No current facility-administered medications on file prior to visit.    The following portions of the patient's history were reviewed and updated as appropriate:  allergies, current medications, past family history, past medical history, past social history, past surgical history and problem list.  ROS Otherwise as in subjective above  Objective: BP (!) 162/94   Pulse 86   Ht 5\' 9"  (1.753 m)   Wt 201 lb (91.2 kg)   SpO2 95%   BMI 29.68 kg/m   General appearance: alert, no distress, well developed, well nourished Neck with tenderness on the right lateral, range of motion quite reduced right rotation but about 50% reduced in other directions.  No mass no thyromegaly no lymphadenopathy No tenderness to back or arms Arms nontender with normal range of motion and no deformity Arms neurovascularly intact     Assessment: Encounter Diagnoses  Name Primary?  . Torticollis Yes  . Neck pain   . Neck muscle spasm   . Essential hypertension, benign      Plan: We discussed symptoms and diagnosis of torticollis.  We discussed using stretching, heat, massage and medication both the next few days.  [name] to try different muscle relaxer.  Discussed caution with sedation musculature.  He declines therapy or massage therapy today but if not improving the next 48 hours we will refer to either massage or physical  therapy.  I encouraged him to use his blood pressure medicine every day as he was noncompliant on the weekends  Jackson Donaldson was seen today for neck pain.  Diagnoses and all orders for this visit:  Torticollis  Neck pain  Neck muscle spasm  Essential hypertension, benign  Other orders -     Naproxen 375 MG TBEC; Take 1 tablet (375 mg total) by mouth in the morning and at bedtime. -     cyclobenzaprine (FLEXERIL) 10 MG tablet; Take 1 tablet (10 mg total) by mouth 2 (two) times daily as needed.    Follow up: As needed

## 2020-07-08 NOTE — Patient Instructions (Signed)
Acute Torticollis, Adult Torticollis is a condition in which the muscles of the neck tighten (contract) abnormally, causing the neck to twist and the head to move into an unnatural position. Torticollis that develops suddenly is called acute torticollis. People with acute torticollis may have trouble turning their head. The condition can be painful and may range from mild to severe. What are the causes? This condition may be caused by:  Sleeping in an awkward position (common).  Extending or twisting the neck muscles beyond their normal position.  An injury to the neck muscles.  An infection.  A tumor.  Certain medicines.  Long-lasting spasms of the neck muscles. In some cases, the cause may not be known. What increases the risk? You are more likely to develop this condition if:  You have a condition associated with loose ligaments, such as Down syndrome.  You have a brain condition that affects vision, such as strabismus. What are the signs or symptoms? The main symptom of this condition is tilting of the head to one side. Other symptoms include:  Pain in the neck.  Trouble turning the head from side to side or up and down. How is this diagnosed? This condition may be diagnosed based on:  A physical exam.  Your medical history.  Imaging tests, such as: ? An X-ray. ? An ultrasound. ? A CT scan. ? An MRI. How is this treated? Treatment for this condition depends on what is causing the condition. Mild cases may go away without treatment. Treatment for more serious cases may include:  Medicines or shots to relax the muscles.  Other medicines, such as antibiotics to treat the underlying cause.  Wearing a soft neck collar.  Physical therapy and stretching to improve neck strength and flexibility.  Neck massage. In severe cases, surgery may be needed to repair dislocated or broken bones or to treat nerves in the neck. Follow these instructions at home:   Take  over-the-counter and prescription medicines only as told by your health care provider.  Do stretching exercises and massage your neck as told by your health care provider.  If directed, apply heat to the affected area as often as told by your health care provider. Use the heat source that your health care provider recommends, such as a moist heat pack or a heating pad. ? Place a towel between your skin and the heat source. ? Leave the heat on for 20-30 minutes. ? Remove the heat if your skin turns bright red. This is especially important if you are unable to feel pain, heat, or cold. You may have a greater risk of getting burned.  If you wake up with torticollis after sleeping, check your bed or sleeping area. Look for lumpy pillows or unusual objects. Make sure your bed and sleeping area are comfortable.  Keep all follow-up visits as told by your health care provider. This is important. Contact a health care provider if:  You have a fever.  Your symptoms do not improve or they get worse. Get help right away if:  You have trouble breathing.  You develop noisy breathing (stridor).  You start to drool.  You have trouble swallowing or pain when swallowing.  You develop numbness or weakness in your hands or feet.  You have changes in your speech, understanding, or vision.  You are in severe pain.  You cannot move your head or neck. Summary  Torticollis is a condition in which the muscles of the neck tighten (contract) abnormally, causing   the neck to twist and the head to move into an unnatural position. Torticollis that develops suddenly is called acute torticollis.  Treatment for this condition depends on what is causing the condition. Mild cases may go away without treatment.  Do stretching exercises and massage your neck as told by your health care provider. You may also be instructed to apply heat to the area.  Contact your health care provider if your symptoms do not  improve or they get worse. This information is not intended to replace advice given to you by your health care provider. Make sure you discuss any questions you have with your health care provider. Document Revised: 06/25/2017 Document Reviewed: 09/10/2016 Elsevier Patient Education  2020 Elsevier Inc.  

## 2020-07-11 ENCOUNTER — Encounter: Payer: 59 | Admitting: Family Medicine

## 2020-07-12 ENCOUNTER — Encounter: Payer: Self-pay | Admitting: Family Medicine

## 2020-07-12 ENCOUNTER — Other Ambulatory Visit: Payer: Self-pay

## 2020-07-12 ENCOUNTER — Ambulatory Visit: Payer: 59 | Admitting: Family Medicine

## 2020-07-12 VITALS — BP 140/80 | HR 84 | Wt 199.6 lb

## 2020-07-12 DIAGNOSIS — I10 Essential (primary) hypertension: Secondary | ICD-10-CM | POA: Diagnosis not present

## 2020-07-12 DIAGNOSIS — R7989 Other specified abnormal findings of blood chemistry: Secondary | ICD-10-CM

## 2020-07-12 DIAGNOSIS — M62838 Other muscle spasm: Secondary | ICD-10-CM | POA: Diagnosis not present

## 2020-07-12 DIAGNOSIS — R739 Hyperglycemia, unspecified: Secondary | ICD-10-CM

## 2020-07-12 DIAGNOSIS — G4733 Obstructive sleep apnea (adult) (pediatric): Secondary | ICD-10-CM | POA: Diagnosis not present

## 2020-07-12 NOTE — Progress Notes (Signed)
° °  Subjective:    Patient ID: Jackson Donaldson, male    DOB: 1960-12-03, 59 y.o.   MRN: [id number]  HPI Chief Complaint  Patient presents with   6 month follow-up    6 month medcheck and still having pain in neck. Doing better but still there. Declines flu or covid shot   He is here for a 6 month medication check.   HTN- States he forgets to take his medications on weekends and days he does not eat breakfast. He is not good about checking his blood pressure at home.  OSA- using CPAP at times. Has a nasal cannula.   Complains of right sided neck pain for the past 3 weeks. No known injury.  He was seen by Kristian Covey, PA, for torticollis and prescribed Naproxen and Flexeril with some relief. States he is taking hydrocodone which she had leftover from previous surgery but he is now out of that medication. Using icy hot and heat.  Reports feeling 60% improved.   No numbness, tingling or weakness.   Denies fever, chills, dizziness, chest pain, palpitations, shortness of breath, abdominal pain, N/V/D, urinary symptoms, LE edema.    Declines flu shot     Review of Systems Pertinent positives and negatives in the history of present illness.     Objective:   Physical Exam BP 140/80    Pulse 84    Wt 199 lb 9.6 oz (90.5 kg)    BMI 29.48 kg/m   Alert and in no distress. Neck is supple, nontender without adenopathy or thyromegaly.  He has good range of motion of his neck.  Cardiac exam shows a regular sinus rhythm without murmurs or gallops. Lungs are clear to auscultation.      Assessment & Plan:  Essential hypertension, benign - Plan: CBC with Differential/Platelet, Comprehensive metabolic panel -Discussed that his blood pressure is slightly above goal and I would like for it to be closer to 130/80.  Encouraged him to take his losartan more consistently.  Recommend low-sodium diet.  I also recommend that he check his blood pressure from time to time to make sure it is not  consistently above goal range.  He will follow-up in 3 months or sooner if needed.  Neck muscle spasm -Much improved.  Continue Aleve and Flexeril as well as heat and gentle stretches for now.  He declines physical therapy.  OSA (obstructive sleep apnea) -Recommend more consistent use of CPAP  Elevated serum creatinine - Plan: CBC with Differential/Platelet, Comprehensive metabolic panel -Counseling on the fact that he has CKD and that is important to keep his blood pressure under good control.  Requested a urine specimen today for urinalysis dipstick but he reports not being able to void.  Recommend good hydration.  Check renal function and follow-up  Elevated serum glucose - Plan: Hemoglobin A1c -Multiple elevated serum glucose results.  Screen for diabetes

## 2020-07-13 LAB — COMPREHENSIVE METABOLIC PANEL
ALT: 29 IU/L (ref 0–44)
AST: 30 IU/L (ref 0–40)
Albumin/Globulin Ratio: 1.1 — ABNORMAL LOW (ref 1.2–2.2)
Albumin: 4.1 g/dL (ref 3.8–4.9)
Alkaline Phosphatase: 112 IU/L (ref 44–121)
BUN/Creatinine Ratio: 21 (ref 10–24)
BUN: 22 mg/dL (ref 8–27)
Bilirubin Total: 0.3 mg/dL (ref 0.0–1.2)
CO2: 21 mmol/L (ref 20–29)
Calcium: 9.3 mg/dL (ref 8.6–10.2)
Chloride: 104 mmol/L (ref 96–106)
Creatinine, Ser: 1.07 mg/dL (ref 0.76–1.27)
GFR calc Af Amer: 87 mL/min/{1.73_m2} (ref >59)
GFR calc non Af Amer: 75 mL/min/{1.73_m2} (ref >59)
Globulin, Total: 3.9 g/dL (ref 1.5–4.5)
Glucose: 109 mg/dL — ABNORMAL HIGH (ref 65–99)
Potassium: 4 mmol/L (ref 3.5–5.2)
Sodium: 137 mmol/L (ref 134–144)
Total Protein: 8 g/dL (ref 6.0–8.5)

## 2020-07-13 LAB — CBC WITH DIFFERENTIAL/PLATELET
Basophils Absolute: 0.1 10*3/uL (ref 0.0–0.2)
Basos: 1 %
EOS (ABSOLUTE): 0.2 10*3/uL (ref 0.0–0.4)
Eos: 5 %
Hematocrit: 42.1 % (ref 37.5–51.0)
Hemoglobin: 13.9 g/dL (ref 13.0–17.7)
Immature Grans (Abs): 0 10*3/uL (ref 0.0–0.1)
Immature Granulocytes: 0 %
Lymphocytes Absolute: 1.4 10*3/uL (ref 0.7–3.1)
Lymphs: 31 %
MCH: 30.5 pg (ref 26.6–33.0)
MCHC: 33 g/dL (ref 31.5–35.7)
MCV: 93 fL (ref 79–97)
Monocytes Absolute: 0.5 10*3/uL (ref 0.1–0.9)
Monocytes: 10 %
Neutrophils Absolute: 2.4 10*3/uL (ref 1.4–7.0)
Neutrophils: 53 %
Platelets: 223 10*3/uL (ref 150–450)
RBC: 4.55 x10E6/uL (ref 4.14–5.80)
RDW: 12.9 % (ref 11.6–15.4)
WBC: 4.6 10*3/uL (ref 3.4–10.8)

## 2020-07-13 LAB — HEMOGLOBIN A1C
Est. average glucose Bld gHb Est-mCnc: 114 mg/dL
Hgb A1c MFr Bld: 5.6 % (ref 4.8–5.6)

## 2020-08-30 IMAGING — CR DG LUMBAR SPINE COMPLETE 4+V
5 series · 5 of 5 positions shown · non-contrast
Comparison: None.

CLINICAL DATA: Midline low back pain

EXAM:
LUMBAR SPINE - COMPLETE 4+ VIEW

[t l-spine a.p.]
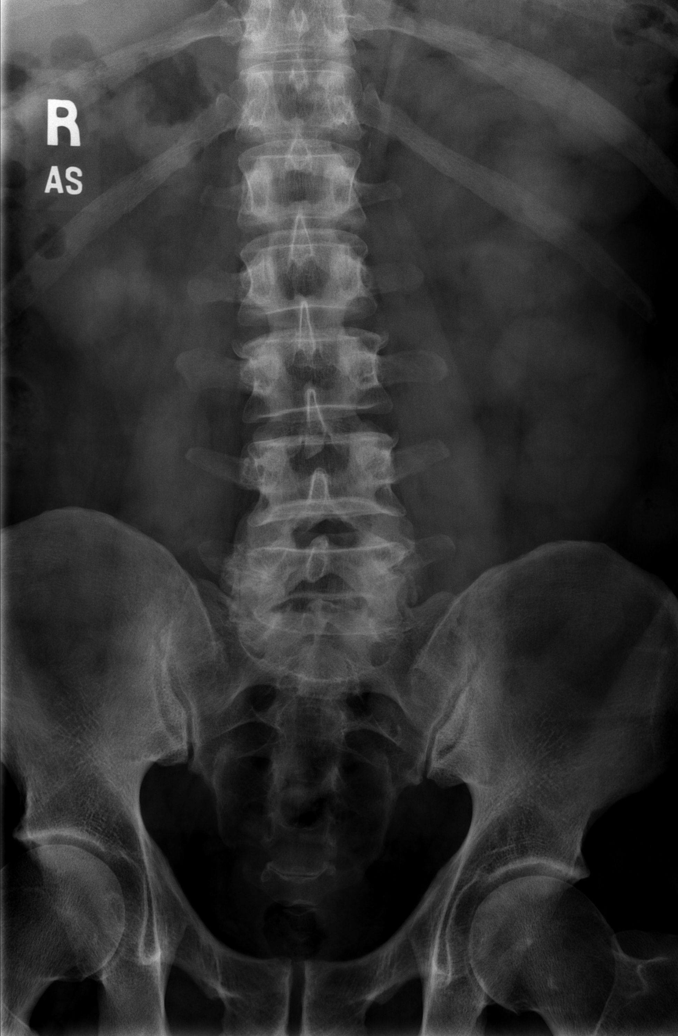

[t l-spine oblique exposure (1 of 2)]
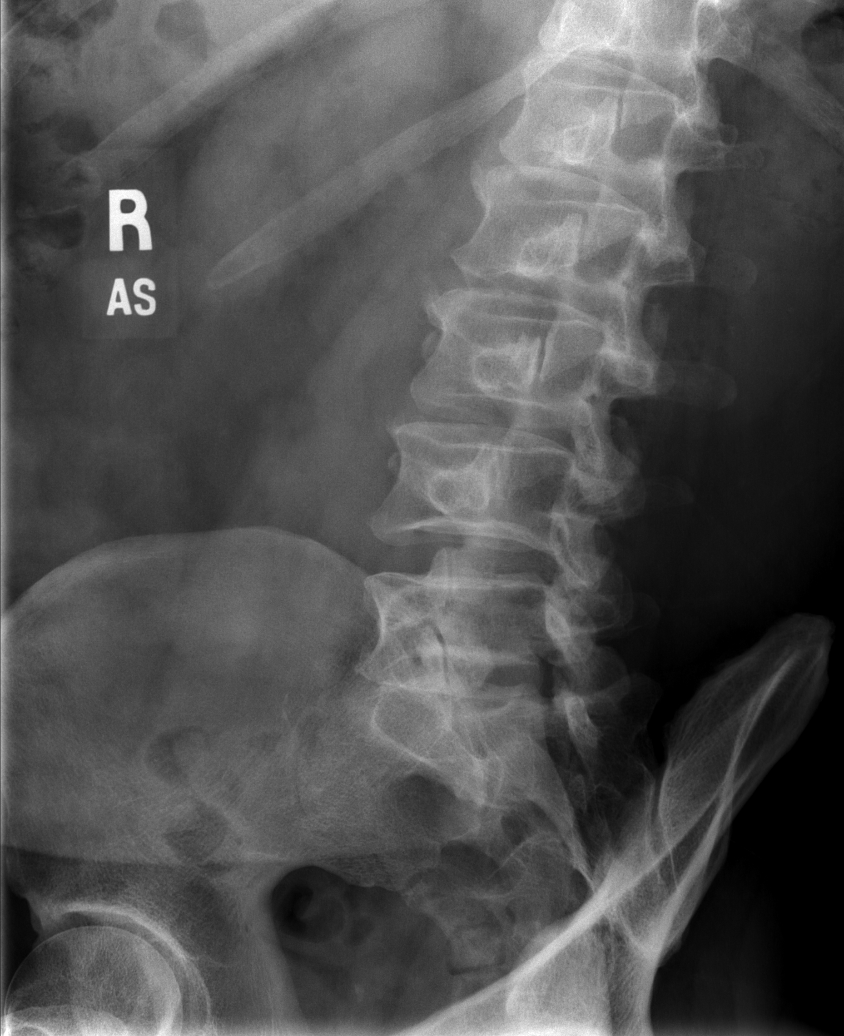

[t l-spine oblique exposure (2 of 2)]
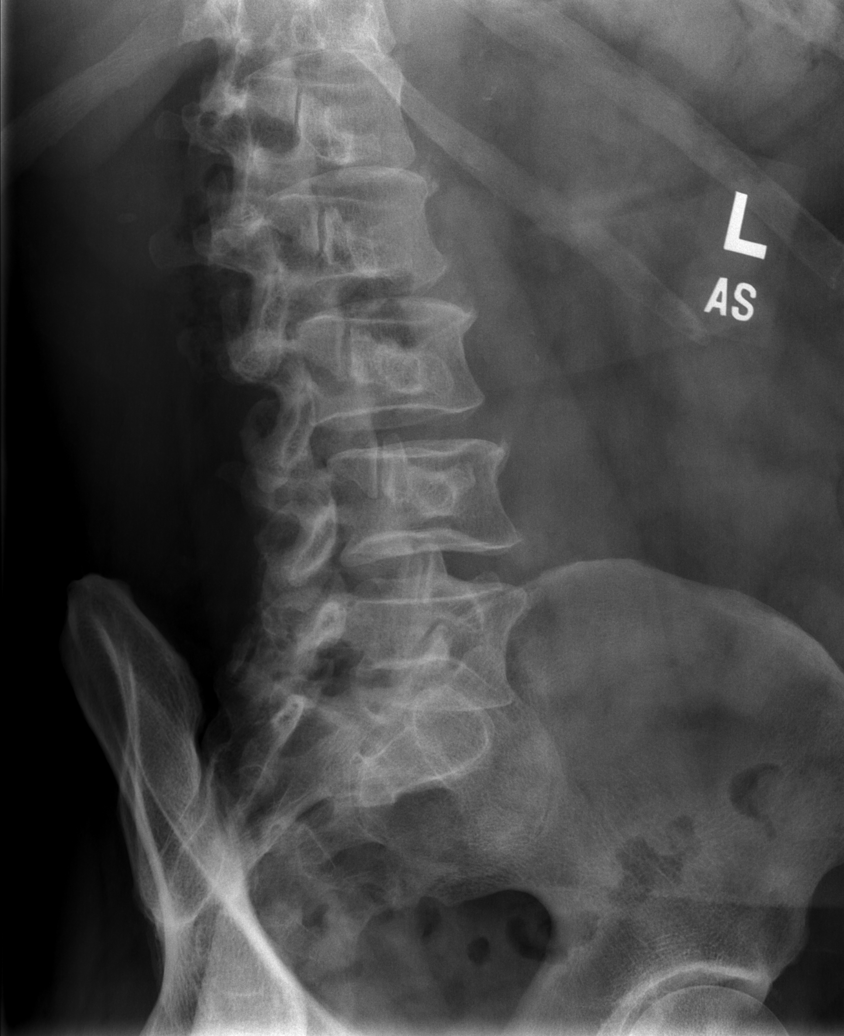

[t l-spine lat]
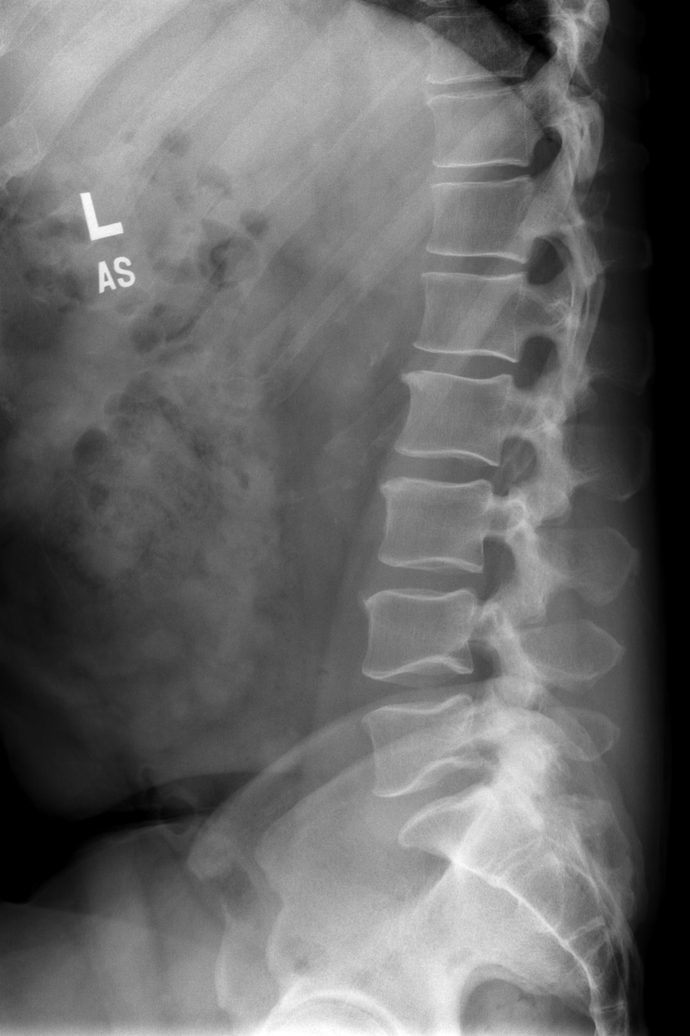

[t l-spine l5-s1 spot]
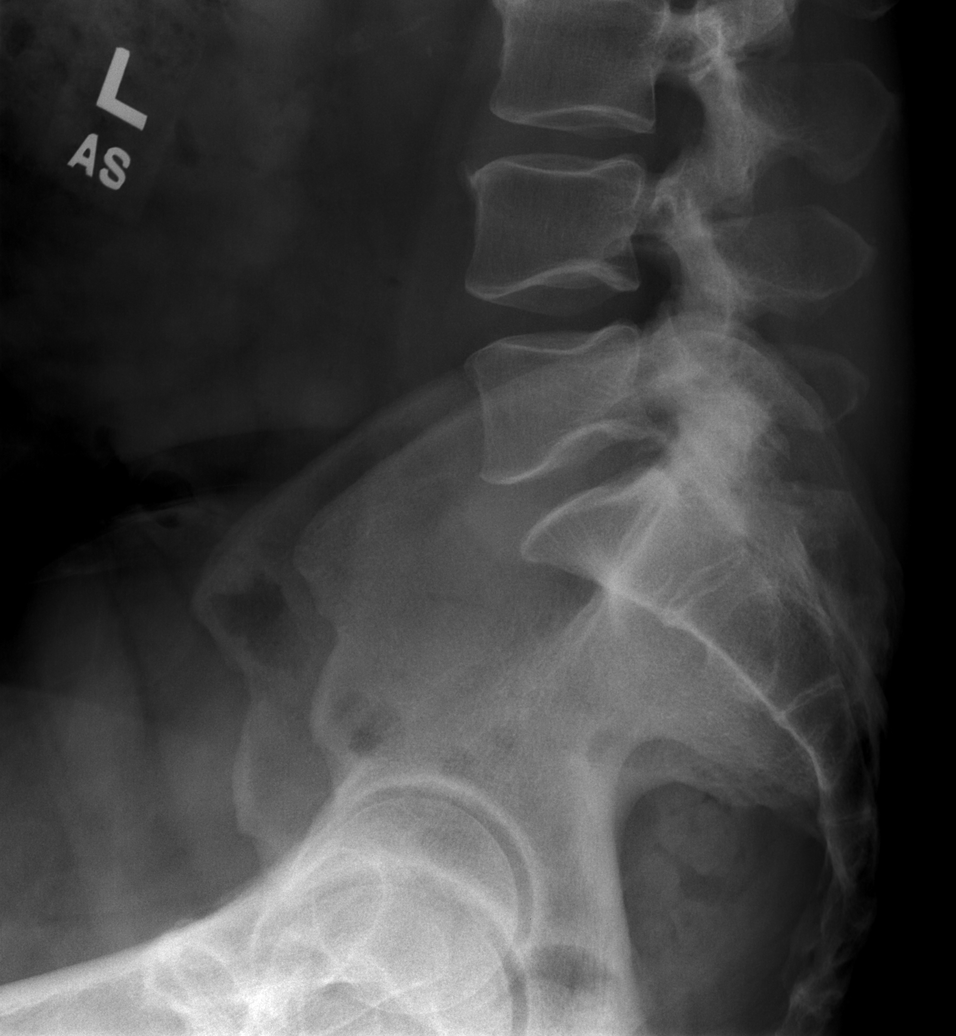

[5 of 5 positions shown; findings below may reference images not displayed]

FINDINGS: Lumbar vertebral body heights and alignment maintained. Mild
endplate irregularity at L4 and L5 probably reflects Schmorl's
nodes. Intervertebral disc heights are maintained. Small posterior
endplate osteophyte at L5-S1. Mild lower lumbar facet hypertrophy.
Sacroiliac joints are unremarkable.
IMPRESSION: Mild degenerative changes at L5-S1.

## 2020-09-05 ENCOUNTER — Other Ambulatory Visit: Payer: Self-pay | Admitting: Family Medicine

## 2020-09-05 DIAGNOSIS — I10 Essential (primary) hypertension: Secondary | ICD-10-CM

## 2020-10-02 ENCOUNTER — Telehealth (INDEPENDENT_AMBULATORY_CARE_PROVIDER_SITE_OTHER): Payer: 59 | Admitting: Family Medicine

## 2020-10-02 ENCOUNTER — Encounter: Payer: Self-pay | Admitting: Family Medicine

## 2020-10-02 ENCOUNTER — Telehealth: Payer: Self-pay | Admitting: Family Medicine

## 2020-10-02 ENCOUNTER — Other Ambulatory Visit: Payer: Self-pay

## 2020-10-02 VITALS — BP 185/104 | HR 71 | Ht 69.0 in | Wt 195.0 lb

## 2020-10-02 DIAGNOSIS — J069 Acute upper respiratory infection, unspecified: Secondary | ICD-10-CM

## 2020-10-02 DIAGNOSIS — R059 Cough, unspecified: Secondary | ICD-10-CM | POA: Diagnosis not present

## 2020-10-02 DIAGNOSIS — I10 Essential (primary) hypertension: Secondary | ICD-10-CM | POA: Diagnosis not present

## 2020-10-02 MED ORDER — BENZONATATE 200 MG PO CAPS: 200.0000 mg | ORAL_CAPSULE | Freq: Three times a day (TID) | ORAL | 0 refills | Status: DC | PRN

## 2020-10-02 NOTE — Progress Notes (Signed)
Start time: 2:48 End time: 3:09  Virtual Visit via Video Note  I connected with Jackson Donaldson on 10/02/20 by a video enabled telemedicine application and verified that I am speaking with the correct person using two identifiers.  Location: Patient: home Provider: office   I discussed the limitations of evaluation and management by telemedicine and the availability of in person appointments. The patient expressed understanding and agreed to proceed.  History of Present Illness:  Chief Complaint  Patient presents with  . Sore Throat    VIRTUAL sore throat and cough that started Monday evening. Mucus is greenish. No fever. Gets bronchitis every spring/fall-he thinks that is what is may be. Cough is worse at night. Also has HA from coughing so much.     2 nights ago he started with sore throat.  He was on the tractor a lot over the weekend, was around a lot of dust. His cough developed yesterday, got worse throughout the day yesterday, coughing up green mucus.  He is wheezing in his chest (no h/o asthma or COPD, can hear the wheezing). He had nasal surgery a few months ago (for deviated septum).  Sees some yellow mucus from the nose (clear, slightly yellow).  Denies sinus pain. Chest and stomach hurts from coughing, and also his head hurts (all over) when coughing.  Nonsmoker.  No fever or chills.   No sick contacts No COVID vaccines. Did home COVID test yesterday afternoon and this morning, both negative.  He has been taking the following OTC medications to treat symptoms: Tylenol Cold and flu Taking Robitussin DM--just started it yesterday afternoon.  Outpatient Encounter Medications as of 10/02/2020  Medication Sig Note  . ibuprofen (ADVIL) 200 MG tablet Take 200 mg by mouth every 6 (six) hours as needed. 10/02/2020: Last dose 9:30  . losartan (COZAAR) 50 MG tablet TAKE 1 TABLET BY MOUTH EVERY DAY   . Pseudoephedrine-APAP-DM (TYLENOL COLD/FLU DAY PO) Take 2 tablets by mouth  every 6 (six) hours.   . ferrous sulfate 325 (65 FE) MG EC tablet Take 325 mg by mouth 3 (three) times daily with meals. (Patient not taking: Reported on 10/02/2020)   . methocarbamol (ROBAXIN) 500 MG tablet Take 500 mg by mouth 4 (four) times daily. (Patient not taking: Reported on 10/02/2020)   . [DISCONTINUED] cyclobenzaprine (FLEXERIL) 10 MG tablet Take 1 tablet (10 mg total) by mouth 2 (two) times daily as needed. (Patient not taking: Reported on 10/02/2020)   . [DISCONTINUED] Naproxen 375 MG TBEC Take 1 tablet (375 mg total) by mouth in the morning and at bedtime.    No facility-administered encounter medications on file as of 10/02/2020.   No Known Allergies  ROS: see HPI.  No n/v/d, bleeding, rash.  +URI symptoms, body aches from coughing.    Observations/Objective:  BP (!) 185/104   Pulse 71   Ht 5\' 9"  (1.753 m)   Wt 195 lb (88.5 kg)   BMI 28.80 kg/m   Well-appearing, pleasant male, in no distress. Rare cough during visit.   He is speaking easily, in no distress He is alert, oriented, normal grooming. Exam is limited due to the virtual nature of the visit.   Assessment and Plan:  Upper respiratory tract infection, unspecified type - suspect viral bronchitis.  Supportive measures reviewed. S/sx bacterial infection reviewed--contact next week if persists/worsens  Cough - discussed Mucinex DM 12 hr (vs q4 hr robitussin DM); can try tessalon (nonsedating, to use prn) - Plan: benzonatate (TESSALON)  200 MG capsule  Essential hypertension, benign - BP very high today. To stop decongestants. Monitor BP   Drink lots of liquids/water. Continue the Robitussin DM ,but take it every 4 hours as directed, OR switch to a longer-acting medication such as Mucinex-DM 12 hour (and take that twice daily instead). These have the same ingredients--an expectorant and a cough suppressant.  Take the benzonatate up to three times daily, if needed for cough (prescription sent to pharmacy, shouldn't  make you sleepy, okay to use during the day)  Stop decongestants (tylenol Cold medicine you're taking)--this is contributing to your blood pressure being very high. (phenylephrine or pseudoephedrine).  Monitor your blood pressure periodically and ensure that it comes back down (goal <130/80, up to 140/90 during an illness is okay.  Contact us if your symptoms persist or worsen--expect some improvement after 5-7 days of symptoms, though symptoms can take up to 2 weeks to completely resolve.  If they get worse during that time frame (recurrent discolored mucus/phlegm, fever, worsening cough, etc), please contact us and give [place] an update. Consider repeating another COVID test in 2 days if not improving.   I encourage you to get your COVID vaccine when you are feeling better.   Follow Up Instructions:   I discussed the assessment and treatment plan with the patient. The patient was provided an opportunity to ask questions and all were answered. The patient agreed with the plan and demonstrated an understanding of the instructions.   The patient was advised to call back or seek an in-person evaluation if the symptoms worsen or if the condition fails to improve as anticipated.  I spent 24 minutes dedicated to the care of this patient, including pre-visit review of records, face to face time, post-visit ordering of testing and documentation.   Lavonda Jumbo, MD

## 2020-10-02 NOTE — Telephone Encounter (Signed)
Spoke with patient-he will try the mucinex and call back if it doesn't help, if he needs the cough syrup called in.

## 2020-10-02 NOTE — Telephone Encounter (Signed)
Pt called and states that he received a call from his pharmacy. He states that insurance will not cover the cough medication that was sent in. Pt is requesting something else be called in. Pt uses [facility] [address] in [place]. Pt can be reached at [phone].

## 2020-10-02 NOTE — Patient Instructions (Signed)
  Drink lots of liquids/water. Continue the Robitussin DM ,but take it every 4 hours as directed, OR switch to a longer-acting medication such as Mucinex-DM 12 hour (and take that twice daily instead). These have the same ingredients--an expectorant and a cough suppressant.  Take the benzonatate up to three times daily, if needed for cough (prescription sent to pharmacy, shouldn't make you sleepy, okay to use during the day)  Stop decongestants (tylenol Cold medicine you're taking)--this is contributing to your blood pressure being very high. (phenylephrine or pseudoephedrine).  Monitor your blood pressure periodically and ensure that it comes back down (goal <130/80, up to 140/90 during an illness is okay.  Contact us if your symptoms persist or worsen--expect some improvement after 5-7 days of symptoms, though symptoms can take up to 2 weeks to completely resolve.  If they get worse during that time frame (recurrent discolored mucus/phlegm, fever, worsening cough, etc), please contact us and give [place] an update. Consider repeating another COVID test in 2 days if not improving.   I encourage you to get your COVID vaccine when you are feeling better.

## 2020-10-02 NOTE — Telephone Encounter (Signed)
Advise pt--there isn't anything similar (nonsedating that can be used during the day), other than trying the Mucinex-DM 12 hour twice daily  (which is over-the-counter). If this isn't working for him, we can send in a narcotic-containing syrup, but he can only take it during the day, not while driving/working.

## 2020-10-02 NOTE — Telephone Encounter (Signed)
Sent to me by mistake 

## 2020-10-11 ENCOUNTER — Ambulatory Visit: Payer: 59 | Admitting: Family Medicine

## 2020-10-11 ENCOUNTER — Encounter: Payer: Self-pay | Admitting: Family Medicine

## 2020-10-11 ENCOUNTER — Other Ambulatory Visit: Payer: Self-pay

## 2020-10-11 VITALS — BP 110/72 | HR 76 | Temp 98.8°F | Resp 16 | Wt 203.2 lb

## 2020-10-11 DIAGNOSIS — J329 Chronic sinusitis, unspecified: Secondary | ICD-10-CM

## 2020-10-11 DIAGNOSIS — R062 Wheezing: Secondary | ICD-10-CM | POA: Diagnosis not present

## 2020-10-11 DIAGNOSIS — K219 Gastro-esophageal reflux disease without esophagitis: Secondary | ICD-10-CM | POA: Diagnosis not present

## 2020-10-11 DIAGNOSIS — R058 Other specified cough: Secondary | ICD-10-CM

## 2020-10-11 MED ORDER — AZITHROMYCIN 250 MG PO TABS: ORAL_TABLET | ORAL | 0 refills | Status: DC

## 2020-10-11 NOTE — Progress Notes (Signed)
   Subjective:    Patient ID: Jackson Donaldson, male    DOB: 1960/12/29, 60 y.o.   MRN: [id number]  HPI Chief Complaint  Patient presents with  . follow-up    Follow-up on URI- coughing a lot at night. Not feeling much better x 1 week ago   He is here with complaints of approximately 2-week history of postnasal drainage and productive cough.  He is also having intermittent chest tightness and wheezing.  Reports history of bronchitis annually.  He feels like he has bronchitis.  He was evaluated virtually by Dr. Lynelle Doctor last week and has been taking Mucinex, NyQuil at bedtime and Tessalon Perles.  He reports no improvement.  He is not a smoker.  No history of lung disease.  He also complains of acid reflux.  States he tries to avoid triggers.  He sleeps flat with a small pillow.  Reports increased coughing at night. He has been taking famotidine twice daily.  He does not think this is helping.  Denies fever, chills, dizziness, palpitations, DOE, orthopnea, abdominal pain, nausea, vomiting, diarrhea or LE edema.   Review of Systems Pertinent positives and negatives in the history of present illness.     Objective:   Physical Exam BP 110/72   Pulse 76   Temp 98.8 F (37.1 C)   Resp 16   Wt 203 lb 3.2 oz (92.2 kg)   SpO2 96%   BMI 30.01 kg/m   Alert and in no distress.  Neck is supple without adenopathy or thyromegaly. Cardiac exam shows a regular sinus rhythm without murmurs or gallops. Lungs are clear upper lung fields and slightly diminished with faint wheezes to bilateral lower lung fields.  Extremities without edema.  Skin is warm and dry.  Normal speech and thought process.       Assessment & Plan:  Productive cough - Plan: DG Chest 2 View, azithromycin (ZITHROMAX) 250 MG tablet  Wheezing - Plan: DG Chest 2 View  Gastroesophageal reflux disease, unspecified whether esophagitis present  Purulent post-nasal discharge  I will send him for chest x-ray and prescribe  azithromycin.  Discussed continuing on Mucinex for now as well as NyQuil at bedtime for the next couple of nights until the antibiotic has time to be effective.  Encourage good hydration. Counseling on lifestyle modification to control GERD.  He will stop famotidine and start taking Prilosec or Nexium.  Follow-up if worsening or not back to baseline in 10 days.

## 2020-10-11 NOTE — Patient Instructions (Signed)
Go to [facility] imaging for your chest x-ray.  Start the antibiotic today.  Continue taking Mucinex daily.  You may use NyQuil at bedtime for the next 2-3 nights if needed.  Stop famotidine (Pepcid) and start taking over-the-counter omeprazole or Nexium 1-2 times daily. Avoid foods that trigger your reflux. Try to allow 2 to 3 hours between eating and laying down.  Prop yourself up at night  Follow-up if you are not back to baseline in 10 days or if you are worsening in the meantime.

## 2020-12-09 ENCOUNTER — Other Ambulatory Visit: Payer: Self-pay

## 2020-12-09 ENCOUNTER — Encounter: Payer: Self-pay | Admitting: Medical

## 2020-12-09 ENCOUNTER — Telehealth: Payer: Self-pay | Admitting: Family Medicine

## 2020-12-09 ENCOUNTER — Ambulatory Visit: Payer: 59 | Admitting: Medical

## 2020-12-09 VITALS — BP 134/86 | HR 92 | Temp 99.1°F | Ht 69.0 in | Wt 204.0 lb

## 2020-12-09 DIAGNOSIS — M7052 Other bursitis of knee, left knee: Secondary | ICD-10-CM | POA: Insufficient documentation

## 2020-12-09 MED ORDER — HYDROCODONE-ACETAMINOPHEN 5-325 MG PO TABS: 1.0000 | ORAL_TABLET | Freq: Four times a day (QID) | ORAL | 0 refills | Status: DC | PRN

## 2020-12-09 MED ORDER — IBUPROFEN 800 MG PO TABS: 800.0000 mg | ORAL_TABLET | Freq: Two times a day (BID) | ORAL | 0 refills | Status: DC | PRN

## 2020-12-09 NOTE — Patient Instructions (Addendum)
Begin Ibuprofen twice daily for 5-7 days with food  You can use the Norco hydrocodone as needed, at least 4 hours separated from Ibuprofen  Rest, Ice, Elevation    Bursitis  Bursitis is when the fluid-filled sac (bursa) that covers and protects a joint is swollen (inflamed). Bursitis is most common near joints such as the knees, elbows, hips, and shoulders. It can cause pain and stiffness. Follow these instructions at home: Medicines  Take over-the-counter and prescription medicines only as told by your doctor.  If you were prescribed an antibiotic medicine, take it as told by your doctor. Do not stop taking it even if you start to feel better. General instructions  Rest the affected area as told by your doctor. ? If you can, raise (elevate) the affected area above the level of your heart while you are sitting or lying down. ? Avoid doing things that make the pain worse.  Use a splint, brace, pad, or walking aid as told by your doctor.  If directed, put ice on the affected area: ? If you have a removable splint or brace, take it off as told by your doctor. ? Put ice in a plastic bag. ? Place a towel between your skin and the bag, or between the splint or brace and the bag. ? Leave the ice on for 20 minutes, 2-3 times a day.  Keep all follow-up visits as told by your doctor. This is important.   Preventing symptoms Do these things to help you not have symptoms again:  Wear knee pads if you kneel often.  Wear sturdy running or walking shoes that fit you well.  Take a lot of breaks during activities that involve doing the same movements again and again.  Before you do any activity that takes a lot of effort, get your body ready by stretching.  Stay at a healthy weight or lose weight if your doctor says you should. If you need help doing this, ask your doctor.  Exercise often. If you start any new physical activity, do it slowly. Contact a doctor if you:  Have a  fever.  Have chills.  Have symptoms that do not get better with treatment or home care. Summary  Bursitis is when the fluid-filled sac (bursa) that covers and protects a joint is swollen.  Rest the affected area as told by your doctor.  Avoid doing things that make the pain worse.  Put ice on the affected area as told by your doctor. This information is not intended to replace advice given to you by your health care provider. Make sure you discuss any questions you have with your health care provider. Document Revised: 01/17/2020 Document Reviewed: 01/17/2020 Elsevier Patient Education  2021 [employer].

## 2020-12-09 NOTE — Progress Notes (Signed)
Subjective:  Jackson Donaldson is a 60 y.o. male who presents for Chief Complaint  Patient presents with  . Knee Pain    Left knee pain      Here for 3 to 4-day history of left knee pain out of the blue.  Denies injury or trauma.  He is an active man.  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  Last week 1 day he just woke up with some pain and swelling in the knee.  He cannot fully extend the knee due to pain.  No fever no body aches or chills.  No recent injury or skin wound.  Using over-the-counter ibuprofen and ice without improvement.  It is little better today than it was last few days.  No other aggravating or relieving factors.    No other c/o.  The following portions of the patient's history were reviewed and updated as appropriate: allergies, current medications, past family history, past medical history, past social history, past surgical history and problem list.  ROS Otherwise as in subjective above   Objective: BP 134/86   Pulse 92   Temp 99.1 F (37.3 C)   Ht 5\' 9"  (1.753 m)   Wt 204 lb (92.5 kg)   SpO2 93%   BMI 30.13 kg/m   General appearance: alert, no distress, well developed, well nourished, white male Left knee tender over the patellar tendon and infrapatellar bursa region with localized swelling.  No obvious warmth no break in the skin or wound.  No other significant joint swelling other than localized around the bursa region.  Cannot fully extend or bend the knee due to pain but has about 80% range of motion.  Rest of leg nontender with normal range of motion Legs otherwise neurovascularly intact Pulses: 2+ radial pulses, 2+ pedal pulses, normal cap refill Ext: no edema   Assessment: Encounter Diagnosis  Name Primary?  . Infrapatellar bursitis of left knee Yes     Plan: We discussed his symptoms and concerns and exam findings.  I had  Dr. , supervising physician also examined him.  We discussed the need for short-term relative rest, ice, leg elevation, begin ibuprofen as below for the next week.  He can use hydrocodone as needed for worse breakthrough pain if needed.  This is short-term and limited use.  With adequate rest and treatment is recommended, this should calm down over the next 7 to 10 days.  If not much improved within the next 10 days then consider recheck  [name] was seen today for knee pain.  Diagnoses and all orders for this visit:  Infrapatellar bursitis of left knee  Other orders -     ibuprofen (ADVIL) 800 MG tablet; Take 1 tablet (800 mg total) by mouth 2 (two) times daily as needed. -     HYDROcodone-acetaminophen (NORCO) 5-325 MG tablet; Take 1 tablet by mouth every 6 (six) hours as needed.    Follow up: prn

## 2020-12-09 NOTE — Telephone Encounter (Signed)
Pt called thought we were calling in antibiotic.  I discussed with Vincenza Hews, he said they did think about it and Dr. Susann Givens looked at the knee also.  They decided not to give antibiotic.  Pt was given antiflammatory med.  He is to take Advil first line and if pain not controllable to take Hydrocodone.    Patient should be better 7 - 10 days.  Pt to report if any fever or if knee becomes red.   Pt understood.

## 2021-01-08 ENCOUNTER — Other Ambulatory Visit: Payer: Self-pay

## 2021-01-08 ENCOUNTER — Other Ambulatory Visit: Payer: Self-pay | Admitting: Family Medicine

## 2021-01-08 DIAGNOSIS — I10 Essential (primary) hypertension: Secondary | ICD-10-CM

## 2021-01-08 MED ORDER — LOSARTAN POTASSIUM 50 MG PO TABS: 1.0000 | ORAL_TABLET | Freq: Every day | ORAL | 0 refills | Status: DC

## 2021-01-08 MED ORDER — IBUPROFEN 800 MG PO TABS: 800.0000 mg | ORAL_TABLET | Freq: Two times a day (BID) | ORAL | 0 refills | Status: DC | PRN

## 2021-01-08 NOTE — Telephone Encounter (Signed)
Please deny med. And I have notified pt

## 2021-04-10 ENCOUNTER — Other Ambulatory Visit: Payer: Self-pay | Admitting: Family Medicine

## 2021-04-10 DIAGNOSIS — I10 Essential (primary) hypertension: Secondary | ICD-10-CM

## 2021-04-16 ENCOUNTER — Telehealth (INDEPENDENT_AMBULATORY_CARE_PROVIDER_SITE_OTHER): Payer: 59 | Admitting: Family Medicine

## 2021-04-16 ENCOUNTER — Other Ambulatory Visit: Payer: Self-pay

## 2021-04-16 VITALS — Wt 204.0 lb

## 2021-04-16 DIAGNOSIS — J0191 Acute recurrent sinusitis, unspecified: Secondary | ICD-10-CM

## 2021-04-16 DIAGNOSIS — G4733 Obstructive sleep apnea (adult) (pediatric): Secondary | ICD-10-CM

## 2021-04-16 MED ORDER — AMOXICILLIN-POT CLAVULANATE 875-125 MG PO TABS: 1.0000 | ORAL_TABLET | Freq: Two times a day (BID) | ORAL | 0 refills | Status: DC

## 2021-04-16 NOTE — Progress Notes (Signed)
   Subjective:    Patient ID: Jackson Donaldson, male    DOB: [birth year], 60 y.o.   MRN: [id number]  HPI Documentation for virtual audio and video telecommunications through Caregility encounter: The patient was located at home. 2 patient identifiers used.  The provider was located in the office. The patient did consent to this visit and is aware of possible charges through their insurance for this visit. The other persons participating in this telemedicine service were none. Time spent on call was 5 minutes and in review of previous records >15 minutes total for counseling and coordination of care. This virtual service is not related to other E/M service within previous 7 days.  He states that 1 week ago he started having difficulty with sneezing coming rhinorrhea, headache, mainly on the right side.  He has been using Benadryl but it can make you drowsy.  He did have a COVID test which was negative.  No fever, sore throat, coughing or earache.  He has had difficulty with sinus disease in the past.  He does have OSA and is on CPAP and doing well with that.  Review of Systems     Objective:   Physical Exam Alert and in no distress otherwise not examined       Assessment & Plan:  Acute recurrent sinusitis, unspecified location - Plan: amoxicillin-clavulanate (AUGMENTIN) 875-125 MG tablet  OSA (obstructive sleep apnea) His symptoms seem to be consistent with a sinus infection and I will therefore put him on treatment.  He will call if further trouble.

## 2021-06-04 ENCOUNTER — Other Ambulatory Visit: Payer: Self-pay | Admitting: Family Medicine

## 2021-07-14 ENCOUNTER — Other Ambulatory Visit: Payer: Self-pay

## 2021-07-14 DIAGNOSIS — I10 Essential (primary) hypertension: Secondary | ICD-10-CM

## 2021-07-14 MED ORDER — LOSARTAN POTASSIUM 50 MG PO TABS: 50.0000 mg | ORAL_TABLET | Freq: Every day | ORAL | 0 refills | Status: DC

## 2021-12-11 ENCOUNTER — Encounter: Payer: Self-pay | Admitting: Family Medicine

## 2021-12-11 ENCOUNTER — Ambulatory Visit: Payer: 59 | Admitting: Family Medicine

## 2021-12-11 VITALS — BP 164/98 | HR 76 | Temp 98.4°F | Wt 205.8 lb

## 2021-12-11 DIAGNOSIS — U071 COVID-19: Secondary | ICD-10-CM | POA: Diagnosis not present

## 2021-12-11 DIAGNOSIS — I10 Essential (primary) hypertension: Secondary | ICD-10-CM | POA: Diagnosis not present

## 2021-12-11 DIAGNOSIS — Z9889 Other specified postprocedural states: Secondary | ICD-10-CM | POA: Diagnosis not present

## 2021-12-11 DIAGNOSIS — R058 Other specified cough: Secondary | ICD-10-CM

## 2021-12-11 MED ORDER — LOSARTAN POTASSIUM 50 MG PO TABS: 50.0000 mg | ORAL_TABLET | Freq: Every day | ORAL | 0 refills | Status: DC

## 2021-12-11 MED ORDER — AZITHROMYCIN 250 MG PO TABS: ORAL_TABLET | ORAL | 0 refills | Status: DC

## 2021-12-11 NOTE — Patient Instructions (Addendum)
Take the pills for the 3 days lets see how you are doing after a week and if you are still having symptoms call You can use Robitussin-DM during the day and try NyQuil at night Set up for complete exam down the road Try either Claritin or Allegra to help dry that up and if that does not work then again let us

## 2021-12-11 NOTE — Progress Notes (Signed)
   Subjective:    Patient ID: Jackson Donaldson, male    DOB: Mar 28, 1961, 61 y.o.   MRN: [id number]  HPI He states that approximately 4 weeks ago he started developing fever, cough, rhinorrhea, fatigue with some wheezing.  He did test positive for COVID at that time.  Since then he has had a resolution of most of his sick symptoms except for coughing and occasional wheezing.  He does not smoke.  States he has no allergies but does complain of rhinorrhea.  He apparently had previous deviated septum surgery. He also needs a refill on his blood pressure medication.  Review of Systems     Objective:   Physical Exam Alert and in no distress. Tympanic membranes and canals are normal. Pharyngeal area is normal. Neck is supple without adenopathy or thyromegaly. Cardiac exam shows a regular sinus rhythm without murmurs or gallops. Lungs are clear to auscultation.        Assessment & Plan:  Productive cough - Plan: azithromycin (ZITHROMAX) 250 MG tablet  COVID  Essential hypertension - Plan: losartan (COZAAR) 50 MG tablet  History of nasal surgery He is to call me if not totally better when he finishes antibiotic.  Recommend antihistamine for the rhinorrhea.  He is to set up for complete examination in the future.  He is also to call back after 1 week of being on azithromycin and if still having symptoms I will give him another Dosepak.

## 2022-01-21 NOTE — Progress Notes (Unsigned)
Established Patient Office Visit  Subjective:  Patient ID: Jackson Donaldson, male    DOB: 1961-04-06  Age: 61 y.o. MRN: [id number]  CC: No chief complaint on file.   HPI Jackson Donaldson presents for ***  Outpatient Medications Prior to Visit  Medication Sig Dispense Refill   amoxicillin-clavulanate (AUGMENTIN) 875-125 MG tablet Take 1 tablet by mouth 2 (two) times daily. 20 tablet 0   azithromycin (ZITHROMAX) 250 MG tablet Take 2 tablets today then 1 tablet daily until finished. 6 tablet 0   benzonatate (TESSALON) 200 MG capsule Take 1 capsule (200 mg total) by mouth 3 (three) times daily as needed. (Patient not taking: No sig reported) 30 capsule 0   diphenhydrAMINE (BENADRYL) 25 MG tablet Take 25 mg by mouth every 6 (six) hours as needed.     HYDROcodone-acetaminophen (NORCO) 5-325 MG tablet Take 1 tablet by mouth every 6 (six) hours as needed. (Patient not taking: Reported on 04/16/2021) 12 tablet 0   ibuprofen (ADVIL) 200 MG tablet Take 200 mg by mouth every 6 (six) hours as needed.     ibuprofen (ADVIL) 800 MG tablet Take 1 tablet (800 mg total) by mouth 2 (two) times daily as needed. (Patient not taking: Reported on 04/16/2021) 30 tablet 0   losartan (COZAAR) 50 MG tablet Take 1 tablet (50 mg total) by mouth daily. 90 tablet 0   methocarbamol (ROBAXIN) 500 MG tablet Take 500 mg by mouth 4 (four) times daily.     Pseudoephedrine-APAP-DM (TYLENOL COLD/FLU DAY PO) Take 2 tablets by mouth every 6 (six) hours. (Patient not taking: Reported on 04/16/2021)     No facility-administered medications prior to visit.    No Known Allergies  Patient Care Team: Avanell Shackleton, NP-C as PCP - General (Family Medicine)  ROS Review of Systems    Objective:    Physical Exam  There were no vitals taken for this visit.  Wt Readings from Last 3 Encounters:  12/11/21 205 lb 12.8 oz (93.4 kg)  04/16/21 204 lb (92.5 kg)  12/09/20 204 lb (92.5 kg)    Results for orders placed or  performed in visit on 07/12/20  CBC with Differential/Platelet  Result Value Ref Range   WBC 4.6 3.4 - 10.8 x10E3/uL   RBC 4.55 4.14 - 5.80 x10E6/uL   Hemoglobin 13.9 13.0 - 17.7 g/dL   Hematocrit 18.5 63.1 - 51.0 %   MCV 93 79 - 97 fL   MCH 30.5 26.6 - 33.0 pg   MCHC 33.0 31.5 - 35.7 g/dL   RDW 49.7 02.6 - 37.8 %   Platelets 223 150 - 450 x10E3/uL   Neutrophils 53 Not Estab. %   Lymphs 31 Not Estab. %   Monocytes 10 Not Estab. %   Eos 5 Not Estab. %   Basos 1 Not Estab. %   Neutrophils Absolute 2.4 1.4 - 7.0 x10E3/uL   Lymphocytes Absolute 1.4 0.7 - 3.1 x10E3/uL   Monocytes Absolute 0.5 0.1 - 0.9 x10E3/uL   EOS (ABSOLUTE) 0.2 0.0 - 0.4 x10E3/uL   Basophils Absolute 0.1 0.0 - 0.2 x10E3/uL   Immature Granulocytes 0 Not Estab. %   Immature Grans (Abs) 0.0 0.0 - 0.1 x10E3/uL  Comprehensive metabolic panel  Result Value Ref Range   Glucose 109 (H) 65 - 99 mg/dL   BUN 22 8 - 27 mg/dL   Creatinine, Ser 5.88 0.76 - 1.27 mg/dL   GFR calc non Af Amer 75 >59 mL/min/1.73   GFR calc Af  Amer 87 >59 mL/min/1.73   BUN/Creatinine Ratio 21 10 - 24   Sodium 137 134 - 144 mmol/L   Potassium 4.0 3.5 - 5.2 mmol/L   Chloride 104 96 - 106 mmol/L   CO2 21 20 - 29 mmol/L   Calcium 9.3 8.6 - 10.2 mg/dL   Total Protein 8.0 6.0 - 8.5 g/dL   Albumin 4.1 3.8 - 4.9 g/dL   Globulin, Total 3.9 1.5 - 4.5 g/dL   Albumin/Globulin Ratio 1.1 (L) 1.2 - 2.2   Bilirubin Total 0.3 0.0 - 1.2 mg/dL   Alkaline Phosphatase 112 44 - 121 IU/L   AST 30 0 - 40 IU/L   ALT 29 0 - 44 IU/L  Hemoglobin A1c  Result Value Ref Range   Hgb A1c MFr Bld 5.6 4.8 - 5.6 %   Est. average glucose Bld gHb Est-mCnc 114 mg/dL     {Labs (EAVWUJWJ):19147}  The 10-year ASCVD risk score (Arnett DK, et al., 2019) is: 16.1%    Assessment & Plan:   Problem List Items Addressed This Visit   None   No orders of the defined types were placed in this encounter.   Follow-up: No follow-ups on file.    Jake Shark, PA-C

## 2022-01-22 ENCOUNTER — Encounter: Payer: Self-pay | Admitting: Physician Assistant

## 2022-01-22 ENCOUNTER — Ambulatory Visit (INDEPENDENT_AMBULATORY_CARE_PROVIDER_SITE_OTHER): Payer: 59 | Admitting: Physician Assistant

## 2022-01-22 VITALS — BP 150/90 | HR 76 | Ht 69.0 in | Wt 208.4 lb

## 2022-01-22 DIAGNOSIS — R5383 Other fatigue: Secondary | ICD-10-CM

## 2022-01-22 DIAGNOSIS — I10 Essential (primary) hypertension: Secondary | ICD-10-CM | POA: Diagnosis not present

## 2022-01-22 DIAGNOSIS — E785 Hyperlipidemia, unspecified: Secondary | ICD-10-CM | POA: Diagnosis not present

## 2022-01-22 DIAGNOSIS — R5381 Other malaise: Secondary | ICD-10-CM | POA: Diagnosis not present

## 2022-01-22 DIAGNOSIS — J310 Chronic rhinitis: Secondary | ICD-10-CM | POA: Diagnosis not present

## 2022-01-22 MED ORDER — LOSARTAN POTASSIUM 100 MG PO TABS: 100.0000 mg | ORAL_TABLET | Freq: Every day | ORAL | 2 refills | Status: DC

## 2022-01-22 NOTE — Assessment & Plan Note (Signed)
Lipid panel checked today, eat a low fat diet, increase fiber intake (Benefiber or Metamucil, Cherrios,  oatmeal, beans, nuts, fruits and vegetables), limit saturated fats (in fried foods, red meat), can add OTC fish oil supplement, eat fish with Omega-3 fatty acids like salmon and tuna, exercise for 30 minutes 3 - 5 times a week, drink 8 - 10 glasses of water a day

## 2022-01-22 NOTE — Assessment & Plan Note (Signed)
OTC nasal steroids not helpful, not able to take decongestants due to HTN, refer to ENT for further evaluation

## 2022-01-22 NOTE — Patient Instructions (Signed)
You will get a call to schedule an appointment with ENT. ?

## 2022-01-22 NOTE — Assessment & Plan Note (Signed)
Poorly controlled, increase to Losartan 100 mg qd, Eat a low salt diet and minimize processed foods (examples canned vegetables, lunch meats, fast food), drink 8 glasses of water a day, exercise 3 - 5 times a week (example walking 30 minutes daily), decrease alcohol intake, decrease smoking or nicotine use, decrease caffeine intake, check blood pressure at home if you have a machine.

## 2022-01-23 LAB — COMPREHENSIVE METABOLIC PANEL
ALT: 40 IU/L (ref 0–44)
AST: 41 IU/L — ABNORMAL HIGH (ref 0–40)
Albumin/Globulin Ratio: 0.9 — ABNORMAL LOW (ref 1.2–2.2)
Albumin: 3.9 g/dL (ref 3.8–4.8)
Alkaline Phosphatase: 107 IU/L (ref 44–121)
BUN/Creatinine Ratio: 17 (ref 10–24)
BUN: 20 mg/dL (ref 8–27)
Bilirubin Total: 0.3 mg/dL (ref 0.0–1.2)
CO2: 17 mmol/L — ABNORMAL LOW (ref 20–29)
Calcium: 9.3 mg/dL (ref 8.6–10.2)
Chloride: 107 mmol/L — ABNORMAL HIGH (ref 96–106)
Creatinine, Ser: 1.16 mg/dL (ref 0.76–1.27)
Globulin, Total: 4.5 g/dL (ref 1.5–4.5)
Glucose: 102 mg/dL — ABNORMAL HIGH (ref 70–99)
Potassium: 4.6 mmol/L (ref 3.5–5.2)
Sodium: 138 mmol/L (ref 134–144)
Total Protein: 8.4 g/dL (ref 6.0–8.5)
eGFR: 72 mL/min/{1.73_m2} (ref >59)

## 2022-01-23 LAB — CBC WITH DIFFERENTIAL/PLATELET
Basophils Absolute: 0 10*3/uL (ref 0.0–0.2)
Basos: 1 %
EOS (ABSOLUTE): 0.2 10*3/uL (ref 0.0–0.4)
Eos: 4 %
Hematocrit: 40 % (ref 37.5–51.0)
Hemoglobin: 13 g/dL (ref 13.0–17.7)
Immature Grans (Abs): 0 10*3/uL (ref 0.0–0.1)
Immature Granulocytes: 0 %
Lymphocytes Absolute: 1.5 10*3/uL (ref 0.7–3.1)
Lymphs: 28 %
MCH: 27.7 pg (ref 26.6–33.0)
MCHC: 32.5 g/dL (ref 31.5–35.7)
MCV: 85 fL (ref 79–97)
Monocytes Absolute: 0.5 10*3/uL (ref 0.1–0.9)
Monocytes: 10 %
Neutrophils Absolute: 3 10*3/uL (ref 1.4–7.0)
Neutrophils: 57 %
Platelets: 235 10*3/uL (ref 150–450)
RBC: 4.69 x10E6/uL (ref 4.14–5.80)
RDW: 15.1 % (ref 11.6–15.4)
WBC: 5.3 10*3/uL (ref 3.4–10.8)

## 2022-04-14 DIAGNOSIS — J310 Chronic rhinitis: Secondary | ICD-10-CM | POA: Diagnosis not present

## 2022-04-14 DIAGNOSIS — H93293 Other abnormal auditory perceptions, bilateral: Secondary | ICD-10-CM | POA: Diagnosis not present

## 2022-04-22 ENCOUNTER — Ambulatory Visit: Payer: 59 | Admitting: Medical

## 2022-04-22 ENCOUNTER — Encounter: Payer: Self-pay | Admitting: Medical

## 2022-04-22 ENCOUNTER — Ambulatory Visit
Admission: RE | Admit: 2022-04-22 | Discharge: 2022-04-22 | Disposition: A | Discharge status: Home / Self Care | Payer: 59 | Source: Ambulatory Visit | Attending: Medical | Admitting: Medical

## 2022-04-22 VITALS — BP 120/80 | HR 62 | Wt 211.2 lb

## 2022-04-22 DIAGNOSIS — Z125 Encounter for screening for malignant neoplasm of prostate: Secondary | ICD-10-CM

## 2022-04-22 DIAGNOSIS — M25561 Pain in right knee: Secondary | ICD-10-CM | POA: Diagnosis not present

## 2022-04-22 DIAGNOSIS — Z2821 Immunization not carried out because of patient refusal: Secondary | ICD-10-CM

## 2022-04-22 DIAGNOSIS — Z13220 Encounter for screening for lipoid disorders: Secondary | ICD-10-CM | POA: Diagnosis not present

## 2022-04-22 DIAGNOSIS — M5136 Other intervertebral disc degeneration, lumbar region: Secondary | ICD-10-CM | POA: Diagnosis not present

## 2022-04-22 DIAGNOSIS — M1711 Unilateral primary osteoarthritis, right knee: Secondary | ICD-10-CM | POA: Diagnosis not present

## 2022-04-22 DIAGNOSIS — Z131 Encounter for screening for diabetes mellitus: Secondary | ICD-10-CM | POA: Diagnosis not present

## 2022-04-22 DIAGNOSIS — G4733 Obstructive sleep apnea (adult) (pediatric): Secondary | ICD-10-CM | POA: Diagnosis not present

## 2022-04-22 DIAGNOSIS — M5440 Lumbago with sciatica, unspecified side: Secondary | ICD-10-CM

## 2022-04-22 DIAGNOSIS — I10 Essential (primary) hypertension: Secondary | ICD-10-CM | POA: Diagnosis not present

## 2022-04-22 DIAGNOSIS — M25562 Pain in left knee: Secondary | ICD-10-CM

## 2022-04-22 DIAGNOSIS — M5441 Lumbago with sciatica, right side: Secondary | ICD-10-CM

## 2022-04-22 DIAGNOSIS — G8929 Other chronic pain: Secondary | ICD-10-CM | POA: Diagnosis not present

## 2022-04-22 DIAGNOSIS — M1712 Unilateral primary osteoarthritis, left knee: Secondary | ICD-10-CM | POA: Diagnosis not present

## 2022-04-22 MED ORDER — LOSARTAN POTASSIUM 100 MG PO TABS: 100.0000 mg | ORAL_TABLET | Freq: Every day | ORAL | 1 refills | Status: DC

## 2022-04-22 MED ORDER — TIZANIDINE HCL 4 MG PO TABS: 4.0000 mg | ORAL_TABLET | Freq: Two times a day (BID) | ORAL | 0 refills | Status: DC | PRN

## 2022-04-22 NOTE — Progress Notes (Signed)
Subjective:  Jackson Donaldson is a 61 y.o. male who presents for Chief Complaint  Patient presents with   med check    Med check- declines flu shot      Here for med check.  Lately having some pains in hip. Was having cramps, and legs felt numb.   Gets leg numbness periodically here and there in right leg down the leg.  Has some flexibility issues, cant bend over to tie shoes like in the past.   HTN - compliant with medication.   No chest pain, no SOB.   BP medication was changed last visit here.    OSA - uses CPAP nightly  Nonsmoker  No other aggravating or relieving factors.    No other c/o.  Past Medical History:  Diagnosis Date   Anemia    Essential hypertension, benign 07/08/2020   GERD (gastroesophageal reflux disease)    Iron deficiency anemia    Kidney stones    No current outpatient medications on file prior to visit.   No current facility-administered medications on file prior to visit.     The following portions of the patient's history were reviewed and updated as appropriate: allergies, current medications, past family history, past medical history, past social history, past surgical history and problem list.  ROS Otherwise as in subjective above   Objective: BP 120/80   Pulse 62   Wt 211 lb 3.2 oz (95.8 kg)   BMI 31.19 kg/m   General appearance: alert, no distress, well developed, well nourished Neck: supple, no lymphadenopathy, no thyromegaly, no masses Heart: RRR, normal S1, S2, no murmurs Lungs: CTA bilaterally, no wheezes, rhonchi, or rales Back nontender, flexibility is little decreased, flexion to about 100 degrees, extension okay, no scoliosis Hips nontender, slightly decreased external range of motion bilaterally but otherwise hips nontender, knees with some generalized tenderness and bony arthritic changes, no obvious swelling or laxity Rest of both the legs unremarkable Pulses: 2+ radial pulses, 2+ pedal pulses, normal cap refill Ext:  no edema   Assessment: Encounter Diagnoses  Name Primary?   Essential hypertension Yes   Influenza vaccination declined    Vaccination declined    OSA (obstructive sleep apnea)    Chronic bilateral low back pain with sciatica, sciatica laterality unspecified    Chronic pain of both knees    Screening for lipid disorders    Screening for prostate cancer    Screening for diabetes mellitus      Plan: Hypertension-blood pressure looks good, continue current medication  Labs today for screening for lipids and other labs as below  PSA prostate cancer screen today  OSA-continue CPAP  Chronic back pain and decreased flexibility-updated x-ray at his request and likely referral to physical therapy  Chronic knee pain bilaterally-likely arthritic related, so for updated knee x-rays today  Jackson Donaldson was seen today for med check.  Diagnoses and all orders for this visit:  Essential hypertension  Influenza vaccination declined  Vaccination declined  OSA (obstructive sleep apnea)  Chronic bilateral low back pain with sciatica, sciatica laterality unspecified -     DG Lumbar Spine Complete; Future  Chronic pain of both knees -     DG Knee Complete 4 Views Left; Future -     DG Knee Complete 4 Views Right; Future  Screening for lipid disorders -     Lipid panel  Screening for prostate cancer -     PSA  Screening for diabetes mellitus -     Hemoglobin  A1c  Other orders -     tiZANidine (ZANAFLEX) 4 MG tablet; Take 1 tablet (4 mg total) by mouth 2 (two) times daily as needed for muscle spasms. -     losartan (COZAAR) 100 MG tablet; Take 1 tablet (100 mg total) by mouth daily.    Follow up: pending labs, xray

## 2022-04-22 NOTE — Patient Instructions (Signed)
Please go to [facility] for your knee xray.   Their hours are 8am - 4:30 pm Monday - Friday.  Take your insurance card with you.  [facility] [phone]  [address], [address] [place], [place] [address]  [address] [place], [place] [address]  

## 2022-04-23 ENCOUNTER — Other Ambulatory Visit: Payer: Self-pay | Admitting: Internal Medicine

## 2022-04-23 DIAGNOSIS — M545 Low back pain, unspecified: Secondary | ICD-10-CM

## 2022-04-23 DIAGNOSIS — G8929 Other chronic pain: Secondary | ICD-10-CM

## 2022-04-23 LAB — LIPID PANEL
Chol/HDL Ratio: 5.4 ratio — ABNORMAL HIGH (ref 0.0–5.0)
Cholesterol, Total: 196 mg/dL (ref 100–199)
HDL: 36 mg/dL — ABNORMAL LOW (ref >39)
LDL Chol Calc (NIH): 115 mg/dL — ABNORMAL HIGH (ref 0–99)
Triglycerides: 257 mg/dL — ABNORMAL HIGH (ref 0–149)
VLDL Cholesterol Cal: 45 mg/dL — ABNORMAL HIGH (ref 5–40)

## 2022-04-23 LAB — PSA: Prostate Specific Ag, Serum: 0.4 ng/mL (ref 0.0–4.0)

## 2022-04-23 LAB — HEMOGLOBIN A1C
Est. average glucose Bld gHb Est-mCnc: 114 mg/dL
Hgb A1c MFr Bld: 5.6 % (ref 4.8–5.6)

## 2022-05-12 DIAGNOSIS — R0982 Postnasal drip: Secondary | ICD-10-CM | POA: Diagnosis not present

## 2022-05-12 DIAGNOSIS — H903 Sensorineural hearing loss, bilateral: Secondary | ICD-10-CM | POA: Diagnosis not present

## 2022-05-20 ENCOUNTER — Other Ambulatory Visit: Payer: Self-pay | Admitting: Medical

## 2022-05-20 MED ORDER — TIZANIDINE HCL 4 MG PO TABS: 4.0000 mg | ORAL_TABLET | Freq: Two times a day (BID) | ORAL | 0 refills | Status: DC | PRN

## 2022-06-12 DIAGNOSIS — R0982 Postnasal drip: Secondary | ICD-10-CM | POA: Diagnosis not present

## 2022-09-01 ENCOUNTER — Other Ambulatory Visit: Payer: Self-pay | Admitting: Medical

## 2022-09-01 NOTE — Telephone Encounter (Signed)
I do not see any notes about PT referral or notiation under xrays. Please advise

## 2022-09-01 NOTE — Telephone Encounter (Signed)
From: Trudi Ida To: Office of Dorothea Ogle, [place] Sent: 09/01/2022 8:53 AM EST Subject: Medication Renewal Request  Refills have been requested for the following medications:   tiZANidine (ZANAFLEX) 4 MG tablet [Shane Tysinger]  Preferred pharmacy: [id number] - [place], [place] Delivery method: [employer]

## 2022-09-03 NOTE — Telephone Encounter (Signed)
Pt is coming in tomorrow  °

## 2022-09-04 ENCOUNTER — Ambulatory Visit (INDEPENDENT_AMBULATORY_CARE_PROVIDER_SITE_OTHER): Payer: 59 | Admitting: Medical

## 2022-09-04 VITALS — BP 120/82 | HR 84 | Wt 205.2 lb

## 2022-09-04 DIAGNOSIS — G8929 Other chronic pain: Secondary | ICD-10-CM | POA: Diagnosis not present

## 2022-09-04 DIAGNOSIS — R5383 Other fatigue: Secondary | ICD-10-CM

## 2022-09-04 DIAGNOSIS — I10 Essential (primary) hypertension: Secondary | ICD-10-CM | POA: Diagnosis not present

## 2022-09-04 DIAGNOSIS — M62830 Muscle spasm of back: Secondary | ICD-10-CM

## 2022-09-04 DIAGNOSIS — G4733 Obstructive sleep apnea (adult) (pediatric): Secondary | ICD-10-CM | POA: Diagnosis not present

## 2022-09-04 DIAGNOSIS — M545 Low back pain, unspecified: Secondary | ICD-10-CM | POA: Diagnosis not present

## 2022-09-04 MED ORDER — TIZANIDINE HCL 4 MG PO TABS: 4.0000 mg | ORAL_TABLET | Freq: Two times a day (BID) | ORAL | 0 refills | Status: DC | PRN

## 2022-09-04 MED ORDER — LOSARTAN POTASSIUM 100 MG PO TABS: 100.0000 mg | ORAL_TABLET | Freq: Every day | ORAL | 1 refills | Status: DC

## 2022-09-04 NOTE — Addendum Note (Signed)
Addended by: Minette Headland A on: 09/04/2022 03:56 PM   Modules accepted: Orders

## 2022-09-04 NOTE — Progress Notes (Signed)
Subjective:  Jackson Donaldson is a 62 y.o. male who presents for Chief Complaint  Patient presents with   follow-up    Follow-up on titanizide - for back,knee, elbow pain. Uses this med sparingly     Here for med check.    Here for recheck on back pain, medication.  He has chronic back pain, has spasm every now and then.  He uses tizanidine every now and then as it really helps.  When pain is really bad he will use Tylenol or Excedrin arthritis.  No leg pain, no numbness ting or weakness, no incontinence, no blood in the stool or urine.  He cannot fully bend to tie his shoes.  HTN - compliant with medication.   No chest pain, no SOB.   BP medication was changed last visit here.    OSA - uses CPAP nightly.  CPAP is only about a year and a half old.  Nonsmoker  He drinks on average 2-3 beers daily  He notes fatigue, always feels tired despite using the CPAP.  In the past he was iron deficient.  No other aggravating or relieving factors.    No other c/o.  Past Medical History:  Diagnosis Date   Anemia    Essential hypertension, benign 07/08/2020   GERD (gastroesophageal reflux disease)    Iron deficiency anemia    Kidney stones    No current outpatient medications on file prior to visit.   No current facility-administered medications on file prior to visit.     The following portions of the patient's history were reviewed and updated as appropriate: allergies, current medications, past family history, past medical history, past social history, past surgical history and problem list.  ROS Otherwise as in subjective above   Objective: BP 120/82   Pulse 84   Wt 205 lb 3.2 oz (93.1 kg)   BMI 30.30 kg/m   BP Readings from Last 3 Encounters:  09/04/22 120/82  04/22/22 120/80  01/22/22 (!) 150/90   Wt Readings from Last 3 Encounters:  09/04/22 205 lb 3.2 oz (93.1 kg)  04/22/22 211 lb 3.2 oz (95.8 kg)  01/22/22 208 lb 6.4 oz (94.5 kg)   General appearance: alert,  no distress, well developed, well nourished Neck: supple, no lymphadenopathy, no thyromegaly, no masses Heart: RRR, normal S1, S2, no murmurs Lungs: CTA bilaterally, no wheezes, rhonchi, or rales Back nontender, flexibility is little decreased, flexion to about 100 degrees, extension okay, no scoliosis Hips nontender, slightly decreased external range of motion bilaterally but otherwise hips nontender, knees with some generalized tenderness and bony arthritic changes, no obvious swelling or laxity Rest of both the legs unremarkable Pulses: 2+ radial pulses, 2+ pedal pulses, normal cap refill Ext: no edema   Assessment: Encounter Diagnoses  Name Primary?   Other fatigue Yes   Chronic bilateral low back pain without sciatica    Spasm of back muscles    Essential hypertension    OSA (obstructive sleep apnea)       Plan: Hypertension-blood pressure looks good, continue current medication  OSA-continue CPAP.  We will try to get a copy of CPAP compliance report  Chronic back pain and decreased flexibility-I reviewed his x-rays 04/22/2022 lumbar spine showing some degenerative changes.  Advised stretching daily, can use tizanidine muscle laxer as needed.  Discussed risk and benefits and proper use of medication.  Caution with sedation.  Referral to physical therapy  Fatigue-we discussed multiple causes of fatigue.  Additional labs as below today.  Continue CPAP.  Alameen was seen today for follow-up.  Diagnoses and all orders for this visit:  Other fatigue -     Testosterone -     TSH -     CBC -     VITAMIN D 25 Hydroxy (Vit-D Deficiency, Fractures)  Chronic bilateral low back pain without sciatica  Spasm of back muscles  Essential hypertension  OSA (obstructive sleep apnea)  Other orders -     losartan (COZAAR) 100 MG tablet; Take 1 tablet (100 mg total) by mouth daily. -     tiZANidine (ZANAFLEX) 4 MG tablet; Take 1 tablet (4 mg total) by mouth 2 (two) times daily as  needed for muscle spasms.    Follow up: pending labs

## 2022-09-04 NOTE — Progress Notes (Signed)
I have called [place] services to get CPAP compliance report.  [phone]

## 2022-09-05 LAB — CBC
Hematocrit: 46.1 % (ref 37.5–51.0)
Hemoglobin: 14.3 g/dL (ref 13.0–17.7)
MCH: 28.3 pg (ref 26.6–33.0)
MCHC: 31 g/dL — ABNORMAL LOW (ref 31.5–35.7)
MCV: 91 fL (ref 79–97)
Platelets: 230 10*3/uL (ref 150–450)
RBC: 5.06 x10E6/uL (ref 4.14–5.80)
RDW: 13.3 % (ref 11.6–15.4)
WBC: 5 10*3/uL (ref 3.4–10.8)

## 2022-09-05 LAB — TESTOSTERONE: Testosterone: 151 ng/dL — ABNORMAL LOW (ref 264–916)

## 2022-09-05 LAB — VITAMIN D 25 HYDROXY (VIT D DEFICIENCY, FRACTURES): Vit D, 25-Hydroxy: 34.2 ng/mL (ref 30.0–100.0)

## 2022-09-05 LAB — TSH: TSH: 1.13 u[IU]/mL (ref 0.450–4.500)

## 2022-09-07 NOTE — Progress Notes (Signed)
Your blood counts are normal, thyroid normal, vitamin D a little low but within normal range  Most notably your testosterone no hormone was low.  This likely could impact your symptoms of fatigue.  Since you are complaining of fatigue and this was low, the next steps would include additional blood work to further evaluate low testosterone before we can possibly do treatment.  This would be a another visit because ago to do additional different labs to rule out certain causes of low testosterone.  But if the additional labs show that the testes do not make enough testosterone, then we can offer treatment  Sabrina -please set up for follow-up appointment on this

## 2022-09-08 ENCOUNTER — Ambulatory Visit: Payer: 59 | Admitting: Medical

## 2022-09-08 VITALS — BP 110/84 | HR 67 | Wt 205.4 lb

## 2022-09-08 DIAGNOSIS — Z789 Other specified health status: Secondary | ICD-10-CM | POA: Diagnosis not present

## 2022-09-08 DIAGNOSIS — R7989 Other specified abnormal findings of blood chemistry: Secondary | ICD-10-CM | POA: Diagnosis not present

## 2022-09-08 DIAGNOSIS — Z136 Encounter for screening for cardiovascular disorders: Secondary | ICD-10-CM | POA: Diagnosis not present

## 2022-09-08 DIAGNOSIS — R5383 Other fatigue: Secondary | ICD-10-CM | POA: Diagnosis not present

## 2022-09-08 DIAGNOSIS — Z7722 Contact with and (suspected) exposure to environmental tobacco smoke (acute) (chronic): Secondary | ICD-10-CM | POA: Diagnosis not present

## 2022-09-08 DIAGNOSIS — E559 Vitamin D deficiency, unspecified: Secondary | ICD-10-CM | POA: Diagnosis not present

## 2022-09-08 MED ORDER — VITAMIN D 50 MCG (2000 UT) PO CAPS: 1.0000 | ORAL_CAPSULE | Freq: Every day | ORAL | 3 refills | Status: DC

## 2022-09-08 MED ORDER — B COMPLEX VITAMINS PO CAPS: 1.0000 | ORAL_CAPSULE | Freq: Every day | ORAL | 3 refills | Status: DC

## 2022-09-08 NOTE — Progress Notes (Signed)
Subjective:  Jackson Donaldson is a 62 y.o. male who presents for Chief Complaint  Patient presents with   follow-up    Follow-up on testosterone and additional blood work     Here for follow-up on recent blood work  He is recent visit labs showed low testosterone.  He was complaining of decreased energy.  Here to recheck on additional labs for low testosterone.  He is interested in treatment  He asked about other cancer screenings that may be available.  He is not a smoker but he is around smokers at work  He does endorse drinking 3-6 beers on a given day  No other aggravating or relieving factors.    No other c/o.  Past Medical History:  Diagnosis Date   Anemia    Essential hypertension, benign 07/08/2020   GERD (gastroesophageal reflux disease)    Iron deficiency anemia    Kidney stones    Current Outpatient Medications on File Prior to Visit  Medication Sig Dispense Refill   losartan (COZAAR) 100 MG tablet Take 1 tablet (100 mg total) by mouth daily. 90 tablet 1   tiZANidine (ZANAFLEX) 4 MG tablet Take 1 tablet (4 mg total) by mouth 2 (two) times daily as needed for muscle spasms. 90 tablet 0   No current facility-administered medications on file prior to visit.     The following portions of the patient's history were reviewed and updated as appropriate: allergies, current medications, past family history, past medical history, past social history, past surgical history and problem list.  ROS Otherwise as in subjective above  Objective: BP 110/84   Pulse 67   Wt 205 lb 6.4 oz (93.2 kg)   BMI 30.33 kg/m   General appearance: alert, no distress, well developed, well nourished Otherwise not examined   Assessment: Encounter Diagnoses  Name Primary?   Low testosterone Yes   Low energy    Screening for heart disease    Regular alcohol consumption    Second hand smoke exposure    Vitamin D deficiency      Plan: Testosterone-additional labs today.  If  labs still point towards a hypogonadal testes issue we will start treatment.  We discussed different treatment options, proper use of treatment, risk and benefits of medications.  Will likely do a topical gel or injectable  Low energy-we discussed possible causes.  He is compliant with his CPAP.  I reviewed his CPAP report yesterday.  He is not currently anemic.  Recent thyroid lab was okay  Vitamin D deficiency-start vitamin D and B complex supplements  Counseled on decreasing his alcohol intake.  Scheduled for CT coronary for heart disease screening  Jackson Donaldson was seen today for follow-up.  Diagnoses and all orders for this visit:  Low testosterone -     Testosterone -     FSH/LH -     Prolactin  Low energy -     Testosterone -     FSH/LH -     Prolactin  Screening for heart disease -     CT CARDIAC SCORING (SELF PAY ONLY); Future  Regular alcohol consumption  Second hand smoke exposure  Vitamin D deficiency  Other orders -     Cholecalciferol (VITAMIN D) 50 MCG (2000 UT) CAPS; Take 1 capsule (2,000 Units total) by mouth daily. -     b complex vitamins capsule; Take 1 capsule by mouth daily.    Follow up: pending labs, study

## 2022-09-09 LAB — FSH/LH
FSH: 3 m[IU]/mL (ref 1.5–12.4)
LH: 2.4 m[IU]/mL (ref 1.7–8.6)

## 2022-09-09 LAB — PROLACTIN: Prolactin: 6.7 ng/mL (ref 3.6–25.2)

## 2022-09-09 LAB — TESTOSTERONE: Testosterone: 263 ng/dL — ABNORMAL LOW (ref 264–916)

## 2022-09-09 NOTE — Progress Notes (Signed)
Thankfully the other hormones were normal, your testosterone is still low  Thus I can offer you testosterone therapy.  You can either check your insurance to see which product is covered or I can go ahead and send something to the pharmacy.  Let me know if you want to try injectable here at our office every 3 to 4 weeks, or a topical daily gel that you use at home, or possibly the oral medication.  Treatment will obviously depend on which product your insurance covers  Continue plan for CT heart test.  Expect a phone call about this.

## 2022-09-22 DIAGNOSIS — M25562 Pain in left knee: Secondary | ICD-10-CM | POA: Diagnosis not present

## 2022-09-22 DIAGNOSIS — M5459 Other low back pain: Secondary | ICD-10-CM | POA: Diagnosis not present

## 2022-09-22 DIAGNOSIS — M25561 Pain in right knee: Secondary | ICD-10-CM | POA: Diagnosis not present

## 2022-09-28 ENCOUNTER — Ambulatory Visit (HOSPITAL_COMMUNITY)
Admission: RE | Admit: 2022-09-28 | Discharge: 2022-09-28 | Disposition: A | Discharge status: Home / Self Care | Payer: Self-pay | Source: Ambulatory Visit | Attending: Medical | Admitting: Medical

## 2022-09-28 ENCOUNTER — Other Ambulatory Visit: Payer: Self-pay | Admitting: Medical

## 2022-09-28 DIAGNOSIS — Z136 Encounter for screening for cardiovascular disorders: Secondary | ICD-10-CM | POA: Insufficient documentation

## 2022-09-28 MED ORDER — ASPIRIN 81 MG PO TBEC: 81.0000 mg | DELAYED_RELEASE_TABLET | Freq: Every day | ORAL | 3 refills | Status: AC

## 2022-09-28 MED ORDER — ROSUVASTATIN CALCIUM 10 MG PO TABS: 10.0000 mg | ORAL_TABLET | Freq: Every day | ORAL | 0 refills | Status: DC

## 2022-09-28 NOTE — Progress Notes (Signed)
Your CT calcium score shows cholesterol plaques and 3 of the 4 coronary arteries.  It was a fairly low score but there is still some atherosclerotic plaque present.  The recommendation would be in addition to low-cholesterol diet to consider taking cholesterol medicine such as Crestor daily to lower risk of future blockages.  Additionally it may be worth adding aspirin daily to lower risk of heart disease as well assuming no history of gastric bleed.  If you are agreeable to cholesterol and aspirin, I will send this to the pharmacy.  The plan would be to follow-up in 6 weeks with an appointment to recheck your cholesterol blood test fasting

## 2022-10-01 ENCOUNTER — Other Ambulatory Visit: Payer: Self-pay | Admitting: Medical

## 2022-10-07 DIAGNOSIS — M5459 Other low back pain: Secondary | ICD-10-CM | POA: Diagnosis not present

## 2022-10-07 DIAGNOSIS — M25561 Pain in right knee: Secondary | ICD-10-CM | POA: Diagnosis not present

## 2022-10-07 DIAGNOSIS — M25562 Pain in left knee: Secondary | ICD-10-CM | POA: Diagnosis not present

## 2022-11-16 ENCOUNTER — Ambulatory Visit: Payer: 59 | Admitting: Medical

## 2022-11-16 VITALS — BP 120/80 | HR 80 | Wt 208.0 lb

## 2022-11-16 DIAGNOSIS — R5383 Other fatigue: Secondary | ICD-10-CM | POA: Diagnosis not present

## 2022-11-16 DIAGNOSIS — I10 Essential (primary) hypertension: Secondary | ICD-10-CM | POA: Diagnosis not present

## 2022-11-16 DIAGNOSIS — R7989 Other specified abnormal findings of blood chemistry: Secondary | ICD-10-CM

## 2022-11-16 DIAGNOSIS — E559 Vitamin D deficiency, unspecified: Secondary | ICD-10-CM

## 2022-11-16 DIAGNOSIS — E782 Mixed hyperlipidemia: Secondary | ICD-10-CM | POA: Diagnosis not present

## 2022-11-16 DIAGNOSIS — Z79899 Other long term (current) drug therapy: Secondary | ICD-10-CM

## 2022-11-16 LAB — LIPID PANEL
Chol/HDL Ratio: 3.5 ratio (ref 0.0–5.0)
Cholesterol, Total: 178 mg/dL (ref 100–199)
HDL: 51 mg/dL (ref >39)
LDL Chol Calc (NIH): 112 mg/dL — ABNORMAL HIGH (ref 0–99)
Triglycerides: 82 mg/dL (ref 0–149)
VLDL Cholesterol Cal: 15 mg/dL (ref 5–40)

## 2022-11-16 LAB — ALT: ALT: 49 IU/L — ABNORMAL HIGH (ref 0–44)

## 2022-11-16 MED ORDER — TESTOSTERONE CYPIONATE 200 MG/ML IM SOLN: 200.0000 mg | INTRAMUSCULAR | 2 refills | Status: DC

## 2022-11-16 NOTE — Progress Notes (Signed)
Subjective:  Jackson Donaldson is a 62 y.o. male who presents for Chief Complaint  Patient presents with   follow-up    Follow-up on cholesterol.      Here to follow-up on prior issues.  Back in September 2023 he is cholesterol showed elevated triglycerides, HDL was low, and at that time he was recommended to make dietary and lifestyle changes.  Here to follow-up on this. He is fasting today.   No hx/o pancreatitis.  Currently drinking 2-3 light beers daily.     He had discussed a CT coronary calcium test at his last visit.  He had mild score   He came back in February 2024 to discuss fatigue, to check testosterone.  Testosterone was in fact low.  At his visit in February he was going to call insurance to see what product is covered by insurance and think about whether he wanted to take treatment or not  Eats some fish.  Eats vegetables often.  In general though, doesn't use discretion.  Eats what he wants.   No other aggravating or relieving factors.    No other c/o.  Past Medical History:  Diagnosis Date   Anemia    Essential hypertension, benign 07/08/2020   GERD (gastroesophageal reflux disease)    Iron deficiency anemia    Kidney stones    Current Outpatient Medications on File Prior to Visit  Medication Sig Dispense Refill   aspirin EC 81 MG tablet Take 1 tablet (81 mg total) by mouth daily. Swallow whole. 90 tablet 3   b complex vitamins capsule Take 1 capsule by mouth daily. 90 capsule 3   Cholecalciferol (VITAMIN D) 50 MCG (2000 UT) CAPS Take 1 capsule (2,000 Units total) by mouth daily. 90 capsule 3   losartan (COZAAR) 100 MG tablet Take 1 tablet (100 mg total) by mouth daily. 90 tablet 1   rosuvastatin (CRESTOR) 10 MG tablet Take 1 tablet (10 mg total) by mouth daily. 90 tablet 0   tiZANidine (ZANAFLEX) 4 MG tablet TAKE 1 TABLET (4 MG TOTAL) BY MOUTH 2 (TWO) TIMES DAILY AS NEEDED FOR MUSCLE SPASMS. 60 tablet 1   No current facility-administered medications on file  prior to visit.     The following portions of the patient's history were reviewed and updated as appropriate: allergies, current medications, past family history, past medical history, past social history, past surgical history and problem list.  ROS Otherwise    Objective: BP 120/80   Pulse 80   Wt 208 lb (94.3 kg)   BMI 30.72 kg/m   Wt Readings from Last 3 Encounters:  11/16/22 208 lb (94.3 kg)  09/08/22 205 lb 6.4 oz (93.2 kg)  09/04/22 205 lb 3.2 oz (93.1 kg)   General appearance: alert, no distress, well developed, well nourished Heart: RRR, normal S1, S2, no murmurs Abdomen: +bs, soft, non tender, non distended, no masses, no hepatomegaly, no splenomegaly Pulses: 2+ radial pulses, 2+ pedal pulses, normal cap refill Ext: no edema   Assessment: Encounter Diagnoses  Name Primary?   Mixed dyslipidemia Yes   High risk medication use    Essential hypertension    Vitamin D deficiency    Low testosterone    Low energy      Plan: Low testosterone, low energy Begin trial of testosterone injection once monthly. Pick up your bottle from the pharmacy and plan a nurse visit to come in and have the injection once a month We will need to recheck your labs in  about 3 months  Mixed dyslipidemia Your triglycerides or fats are too high. In September your HDL good cholesterol is too low Continue Crestor to lower your risk of heart disease and stroke since you did have some mild coronary artery disease on your recent scan Eat a healthy low-fat low-cholesterol diet  Recommendations for improving lipids:  Foods TO AVOID or limit - fried foods, high sugar foods, white bread, enriched flour, fast food, red meat, large amounts of cheese, processed foods such as little debbie cakes, cookies, pies, donuts, for example  Foods TO INCLUDE in the diet - whole grains such as whole grain pasta, whole grain bread, barley, steel cut oatmeal (not instant oatmeal), avocado, fish, green leafy  vegetables, nuts, increased fiber in diet, and using olive oil in small amounts for cooking or as salad dressing vinaigrette.      Hypertension  continue current medication losartan 100 mg daily   Vitamin D deficiency-continue vitamin D supplement    Jackson Donaldson was seen today for follow-up.  Diagnoses and all orders for this visit:  Mixed dyslipidemia -     Lipid panel -     ALT  High risk medication use -     Lipid panel -     ALT  Essential hypertension  Vitamin D deficiency  Low testosterone  Low energy  Other orders -     testosterone cypionate (DEPOTESTOSTERONE CYPIONATE) 200 MG/ML injection; Inject 1 mL (200 mg total) into the muscle every 28 (twenty-eight) days.    Follow up: pending labs, 28mo

## 2022-11-16 NOTE — Patient Instructions (Signed)
Low testosterone, low energy Begin trial of testosterone injection once monthly. Pick up your bottle from the pharmacy and plan a nurse visit to come in and have the injection once a month We will need to recheck your labs in about 3 months  Mixed dyslipidemia Your triglycerides or fats are too high. In September your HDL good cholesterol is too low Continue Crestor to lower your risk of heart disease and stroke since you did have some mild coronary artery disease on your recent scan Eat a healthy low-fat low-cholesterol diet  Recommendations for improving lipids:  Foods TO AVOID or limit - fried foods, high sugar foods, white bread, enriched flour, fast food, red meat, large amounts of cheese, processed foods such as little debbie cakes, cookies, pies, donuts, for example  Foods TO INCLUDE in the diet - whole grains such as whole grain pasta, whole grain bread, barley, steel cut oatmeal (not instant oatmeal), avocado, fish, green leafy vegetables, nuts, increased fiber in diet, and using olive oil in small amounts for cooking or as salad dressing vinaigrette.      Hypertension  continue current medication losartan 100 mg daily   Vitamin D deficiency-continue vitamin D supplement

## 2022-11-17 NOTE — Progress Notes (Signed)
Your labs are improved thankfully.  I believe the Crestor is working well and you must be doing better with your dietary choices.  Continue with a low-fat low-cholesterol diet, try not to drink quite as much alcohol  Continue current medications   Yesterday we talked about the testosterone injection.  There is another product that I forgot to mention yesterday.  It is a weekly subcutaneous shot this much easier to use.  If you have not picked up the testosterone yet that I sent yesterday I could switch to this new and see if insurance will cover it.  It would be easy to use and you can do it at home once a week  If you already picked up the testosterone from yesterday, that is fine, go ahead and plan a time to come in and have your first injection

## 2022-12-18 ENCOUNTER — Ambulatory Visit: Payer: 59 | Admitting: Medical

## 2022-12-18 VITALS — BP 130/80 | HR 61 | Temp 98.1°F | Wt 209.2 lb

## 2022-12-18 DIAGNOSIS — M7989 Other specified soft tissue disorders: Secondary | ICD-10-CM | POA: Diagnosis not present

## 2022-12-18 DIAGNOSIS — M79672 Pain in left foot: Secondary | ICD-10-CM | POA: Diagnosis not present

## 2022-12-18 NOTE — Progress Notes (Signed)
Subjective:  Jackson Donaldson is a 62 y.o. male who presents for Chief Complaint  Patient presents with   Foot Swelling    Foot pain x 2-3 days. Thinks he step wrong going down steps on Sunday. Redness and swelling and having pain walking on it     Here for complaint of left  foot pain times a few days.  About 6 days ago he was coming down the stairs and took a misstep.  Since then he is having redness and swelling and pain.   No numbness, tingling or weakness.   Using nothing for symptoms.   No other aggravating or relieving factors.    No other c/o.  Past Medical History:  Diagnosis Date   Anemia    Essential hypertension, benign 07/08/2020   GERD (gastroesophageal reflux disease)    Iron deficiency anemia    Kidney stones    Current Outpatient Medications on File Prior to Visit  Medication Sig Dispense Refill   aspirin EC 81 MG tablet Take 1 tablet (81 mg total) by mouth daily. Swallow whole. 90 tablet 3   b complex vitamins capsule Take 1 capsule by mouth daily. 90 capsule 3   Cholecalciferol (VITAMIN D) 50 MCG (2000 UT) CAPS Take 1 capsule (2,000 Units total) by mouth daily. 90 capsule 3   losartan (COZAAR) 100 MG tablet Take 1 tablet (100 mg total) by mouth daily. 90 tablet 1   rosuvastatin (CRESTOR) 10 MG tablet Take 1 tablet (10 mg total) by mouth daily. 90 tablet 0   testosterone cypionate (DEPOTESTOSTERONE CYPIONATE) 200 MG/ML injection Inject 1 mL (200 mg total) into the muscle every 28 (twenty-eight) days. 10 mL 2   tiZANidine (ZANAFLEX) 4 MG tablet TAKE 1 TABLET (4 MG TOTAL) BY MOUTH 2 (TWO) TIMES DAILY AS NEEDED FOR MUSCLE SPASMS. (Patient not taking: Reported on 12/18/2022) 60 tablet 1   No current facility-administered medications on file prior to visit.     The following portions of the patient's history were reviewed and updated as appropriate: allergies, current medications, past family history, past medical history, past social history, past surgical history and  problem list.  ROS Otherwise as in subjective above  Objective: BP 130/80   Pulse 61   Temp 98.1 F (36.7 C)   Wt 209 lb 3.2 oz (94.9 kg)   BMI 30.89 kg/m   General appearance: alert, no distress, well developed, well nourished Left foot volar surface over the MTP of the big toe with tenderness and some mild swelling.  Mild pink discoloration.  Otherwise foot nontender with normal range of motion.  Toes all with normal strength and sensation and normal range of motion except for little bit decrease of the great toe.  He is only tender really over the sesamoid bone region of the great toe. No other deformity or abnormality of the foot or leg Normal pulses and cap refill   Assessment: Encounter Diagnoses  Name Primary?   Left foot pain Yes   Foot swelling      Plan: Discussed symptoms and exam findings.  He likely has a contusion over the sesamoid bones of the left great toe.  Offered x-ray but he declines today.  I doubt a fracture at this point.   Patient Instructions  Foot pain and bruising  Recommendations: Rest the foot when possible for the next week Begin over the counter Aleve, 1 or 2 tablets twice daily for the next 4-5 days for pain and inflammation Use bucket of  cold water or ice water 15 minutes 2-3 times daily Consider CAM walker or post op shoe to protect and support the foot for the next week If not much improved in the next week then we consider xray     Jackson Donaldson was seen today for foot swelling.  Diagnoses and all orders for this visit:  Left foot pain  Foot swelling    Follow up: prn

## 2022-12-18 NOTE — Patient Instructions (Signed)
Foot pain and bruising  Recommendations: Rest the foot when possible for the next week Begin over the counter Aleve, 1 or 2 tablets twice daily for the next 4-5 days for pain and inflammation Use bucket of cold water or ice water 15 minutes 2-3 times daily Consider CAM walker or post op shoe to protect and support the foot for the next week If not much improved in the next week then we consider xray

## 2022-12-26 ENCOUNTER — Telehealth: Payer: Self-pay | Admitting: Medical

## 2022-12-26 NOTE — Telephone Encounter (Signed)
P.A. TESTOSTERONE CYPIONATE approved, pt informed, faxed pharmacy

## 2023-01-05 ENCOUNTER — Other Ambulatory Visit (INDEPENDENT_AMBULATORY_CARE_PROVIDER_SITE_OTHER): Payer: 59

## 2023-01-05 DIAGNOSIS — E291 Testicular hypofunction: Secondary | ICD-10-CM

## 2023-01-05 DIAGNOSIS — R7989 Other specified abnormal findings of blood chemistry: Secondary | ICD-10-CM | POA: Diagnosis not present

## 2023-01-05 MED ORDER — TESTOSTERONE CYPIONATE 200 MG/ML IM SOLN
200.0000 mg | INTRAMUSCULAR | Status: DC
Start: 2023-01-05 — End: 2023-09-03
  Administered 2023-01-05: 200 mg via INTRAMUSCULAR

## 2023-02-02 ENCOUNTER — Other Ambulatory Visit (INDEPENDENT_AMBULATORY_CARE_PROVIDER_SITE_OTHER): Payer: 59

## 2023-02-02 DIAGNOSIS — R7989 Other specified abnormal findings of blood chemistry: Secondary | ICD-10-CM

## 2023-02-02 DIAGNOSIS — E291 Testicular hypofunction: Secondary | ICD-10-CM | POA: Diagnosis not present

## 2023-02-02 MED ORDER — TESTOSTERONE CYPIONATE 200 MG/ML IM SOLN
200.0000 mg | INTRAMUSCULAR | Status: DC
Start: 2023-02-02 — End: 2023-09-03
  Administered 2023-02-02: 200 mg via INTRAMUSCULAR

## 2023-02-16 ENCOUNTER — Ambulatory Visit: Payer: 59 | Admitting: Medical

## 2023-02-16 NOTE — Progress Notes (Deleted)
See April visit, TST initiation?

## 2023-02-22 ENCOUNTER — Other Ambulatory Visit: Payer: Self-pay | Admitting: Medical

## 2023-02-23 ENCOUNTER — Encounter: Payer: Self-pay | Admitting: Medical

## 2023-02-23 ENCOUNTER — Ambulatory Visit (INDEPENDENT_AMBULATORY_CARE_PROVIDER_SITE_OTHER): Payer: 59 | Admitting: Medical

## 2023-02-23 VITALS — BP 134/84 | HR 64 | Ht 70.0 in | Wt 211.6 lb

## 2023-02-23 DIAGNOSIS — R5383 Other fatigue: Secondary | ICD-10-CM | POA: Diagnosis not present

## 2023-02-23 DIAGNOSIS — R0981 Nasal congestion: Secondary | ICD-10-CM

## 2023-02-23 DIAGNOSIS — J3489 Other specified disorders of nose and nasal sinuses: Secondary | ICD-10-CM

## 2023-02-23 DIAGNOSIS — R7989 Other specified abnormal findings of blood chemistry: Secondary | ICD-10-CM | POA: Diagnosis not present

## 2023-02-23 DIAGNOSIS — I10 Essential (primary) hypertension: Secondary | ICD-10-CM | POA: Diagnosis not present

## 2023-02-23 MED ORDER — TESTOSTERONE CYPIONATE 200 MG/ML IM SOLN: 200.0000 mg | INTRAMUSCULAR | 2 refills | Status: DC

## 2023-02-23 MED ORDER — IPRATROPIUM BROMIDE 0.03 % NA SOLN: 2.0000 | Freq: Two times a day (BID) | NASAL | 1 refills | Status: DC

## 2023-02-23 NOTE — Progress Notes (Signed)
Subjective:  Jackson Donaldson is a 62 y.o. male who presents for Chief Complaint  Patient presents with   Medical Management of Chronic Issues    Recheck on testosterone. Has had two shots so far.      Here for med check.  Diagnosed with low testosterone a few months ago.  There was initial delay in getting medication due to insurance prior authorization.  At this point he has done 2 injections 28 days apart and has seen no improvement whatsoever in energy levels.  He is coming here for IM testosterone injection  He is having some problems with nasal congestion and drippy nose.  He already uses antihistamine and uses either nasal steroid or nasal antihistamine spray.  He would like referral back to ENT.  He notes history of prior sinus surgery.  He does take supplements but curious about his iron.  No other aggravating or relieving factors.    No other c/o.  Past Medical History:  Diagnosis Date   Anemia    Essential hypertension, benign 07/08/2020   GERD (gastroesophageal reflux disease)    Iron deficiency anemia    Kidney stones    Current Outpatient Medications on File Prior to Visit  Medication Sig Dispense Refill   aspirin EC 81 MG tablet Take 1 tablet (81 mg total) by mouth daily. Swallow whole. 90 tablet 3   b complex vitamins capsule Take 1 capsule by mouth daily. 90 capsule 3   Cholecalciferol (VITAMIN D) 50 MCG (2000 UT) CAPS Take 1 capsule (2,000 Units total) by mouth daily. 90 capsule 3   losartan (COZAAR) 100 MG tablet Take 1 tablet (100 mg total) by mouth daily. 90 tablet 1   rosuvastatin (CRESTOR) 10 MG tablet TAKE 1 TABLET BY MOUTH EVERY DAY 30 tablet 2   tiZANidine (ZANAFLEX) 4 MG tablet TAKE 1 TABLET (4 MG TOTAL) BY MOUTH 2 (TWO) TIMES DAILY AS NEEDED FOR MUSCLE SPASMS. (Patient not taking: Reported on 12/18/2022) 60 tablet 1   Current Facility-Administered Medications on File Prior to Visit  Medication Dose Route Frequency Provider Last Rate Last Admin    testosterone cypionate (DEPOTESTOSTERONE CYPIONATE) injection 200 mg  200 mg Intramuscular Q28 days Jac Canavan, PA-C   200 mg at 01/05/23 1014   testosterone cypionate (DEPOTESTOSTERONE CYPIONATE) injection 200 mg  200 mg Intramuscular Q28 days Jac Canavan, PA-C   200 mg at 02/02/23 1022     The following portions of the patient's history were reviewed and updated as appropriate: allergies, current medications, past family history, past medical history, past social history, past surgical history and problem list.  ROS Otherwise as in subjective above  Objective: BP 134/84   Pulse 64   Ht 5\' 10"  (1.778 m)   Wt 211 lb 9.6 oz (96 kg)   BMI 30.36 kg/m   General appearance: alert, no distress, well developed, well nourished HEENT: normocephalic, sclerae anicteric, conjunctiva pink and moist, TMs pearly, nares patent, no discharge or erythema, pharynx normal Oral cavity: MMM, no lesions Neck: supple, no lymphadenopathy, no thyromegaly, no masses   Assessment: Encounter Diagnoses  Name Primary?   Low energy Yes   Nasal congestion    Rhinorrhea    Other fatigue    Essential hypertension    Low testosterone      Plan: Low energy, low testosterone-changed to every 14-day injection as every 28-day injection as giving him no benefit at all.  If no improvement the next month consider different testosterone product  Nasal congestion rhinorrhea-he has failed antihistamine oral as well as nasal sprays for both nasal antihistamine and nasal steroid.  Begin trial of anticholinergic nasal spray and referral to ENT  Hypertension-continue current medication  The next time we do labs we will plan to check a B12 and iron level.  He has had low iron in the past.  Recent CBC was normal  Jackson Longs "joe" was seen today for medical management of chronic issues.  Diagnoses and all orders for this visit:  Low energy  Nasal congestion -     Ambulatory referral to ENT  Rhinorrhea -      Ambulatory referral to ENT  Other fatigue  Essential hypertension  Low testosterone  Other orders -     testosterone cypionate (DEPOTESTOSTERONE CYPIONATE) 200 MG/ML injection; Inject 1 mL (200 mg total) into the muscle every 14 (fourteen) days. -     ipratropium (ATROVENT) 0.03 % nasal spray; Place 2 sprays into both nostrils every 12 (twelve) hours.    Follow up: 109mo

## 2023-02-24 ENCOUNTER — Other Ambulatory Visit: Payer: 59

## 2023-02-25 ENCOUNTER — Other Ambulatory Visit (INDEPENDENT_AMBULATORY_CARE_PROVIDER_SITE_OTHER): Payer: 59

## 2023-02-25 DIAGNOSIS — R7989 Other specified abnormal findings of blood chemistry: Secondary | ICD-10-CM | POA: Diagnosis not present

## 2023-02-25 MED ORDER — TESTOSTERONE CYPIONATE 200 MG/ML IM SOLN
200.0000 mg | INTRAMUSCULAR | Status: DC
Start: 2023-02-25 — End: 2023-09-03
  Administered 2023-02-25: 200 mg via INTRAMUSCULAR

## 2023-03-03 ENCOUNTER — Ambulatory Visit: Payer: 59

## 2023-03-12 ENCOUNTER — Other Ambulatory Visit: Payer: 59

## 2023-03-15 ENCOUNTER — Other Ambulatory Visit (INDEPENDENT_AMBULATORY_CARE_PROVIDER_SITE_OTHER): Payer: 59

## 2023-03-15 DIAGNOSIS — R7989 Other specified abnormal findings of blood chemistry: Secondary | ICD-10-CM

## 2023-03-15 MED ORDER — TESTOSTERONE CYPIONATE 200 MG/ML IM SOLN
200.0000 mg | INTRAMUSCULAR | Status: DC
Start: 2023-03-15 — End: 2023-09-03
  Administered 2023-03-15: 200 mg via INTRAMUSCULAR

## 2023-03-30 ENCOUNTER — Other Ambulatory Visit: Payer: 59

## 2023-04-18 ENCOUNTER — Other Ambulatory Visit: Payer: Self-pay | Admitting: Medical

## 2023-07-20 ENCOUNTER — Other Ambulatory Visit: Payer: Self-pay | Admitting: Medical

## 2023-09-03 ENCOUNTER — Other Ambulatory Visit: Payer: Self-pay | Admitting: Medical

## 2023-09-03 ENCOUNTER — Ambulatory Visit (INDEPENDENT_AMBULATORY_CARE_PROVIDER_SITE_OTHER): Payer: 59 | Admitting: Medical

## 2023-09-03 VITALS — BP 124/82 | HR 70 | Temp 98.0°F | Wt 202.4 lb

## 2023-09-03 DIAGNOSIS — R0989 Other specified symptoms and signs involving the circulatory and respiratory systems: Secondary | ICD-10-CM

## 2023-09-03 DIAGNOSIS — J3489 Other specified disorders of nose and nasal sinuses: Secondary | ICD-10-CM

## 2023-09-03 DIAGNOSIS — J4530 Mild persistent asthma, uncomplicated: Secondary | ICD-10-CM | POA: Diagnosis not present

## 2023-09-03 DIAGNOSIS — R053 Chronic cough: Secondary | ICD-10-CM

## 2023-09-03 MED ORDER — BUDESONIDE-FORMOTEROL FUMARATE 160-4.5 MCG/ACT IN AERO: 2.0000 | INHALATION_SPRAY | Freq: Two times a day (BID) | RESPIRATORY_TRACT | 0 refills | Status: DC

## 2023-09-03 MED ORDER — BENZONATATE 100 MG PO CAPS: 100.0000 mg | ORAL_CAPSULE | Freq: Two times a day (BID) | ORAL | 0 refills | Status: DC | PRN

## 2023-09-03 MED ORDER — BECLOMETHASONE DIPROPIONATE 80 MCG/ACT NA AERS: 1.0000 | INHALATION_SPRAY | Freq: Two times a day (BID) | NASAL | 2 refills | Status: DC

## 2023-09-03 MED ORDER — AZITHROMYCIN 500 MG PO TABS: ORAL_TABLET | ORAL | 0 refills | Status: DC

## 2023-09-03 MED ORDER — PREDNISONE 20 MG PO TABS: ORAL_TABLET | ORAL | 0 refills | Status: DC

## 2023-09-03 NOTE — Progress Notes (Signed)
 Subjective:  Jackson Donaldson is a 63 y.o. male who presents for Chief Complaint  Patient presents with   Cough    Cough since Christmas. The cough comes and goes but feels like its stuck in throat. Will get headache from coughing so much.      Here for cough.   Got sick after Christmas and can't get rid of it.  Even tried some of his girlfriend antibiotics.     Cough comes and goes . Will have cough fits. Sometimes cough so much he vomits.  Wakes up sometimes coughing.   Does sometimes have runny nose.  Has nasal surgery a few years ago.  Still uses ipratropium nasal.  In the past has used Flonase , Ipratropium, other OTC nasal sprays as well.    No other symptoms.  No fever, chills, sore throat, no ear pain, no sinus pain, no reflux, no burping but has hx/o heartburn, but uses medicaiton for this regularly.  Nonsmoker.  No hx/o asthma or lung disease.  Has had to use inhalers in past with sick, has hx/o bronchitis.  No other aggravating or relieving factors.    No other c/o.  Past Medical History:  Diagnosis Date   Anemia    Essential hypertension, benign 07/08/2020   GERD (gastroesophageal reflux disease)    Iron deficiency anemia    Kidney stones    Current Outpatient Medications on File Prior to Visit  Medication Sig Dispense Refill   b complex vitamins capsule Take 1 capsule by mouth daily. 90 capsule 3   Cholecalciferol (VITAMIN D ) 50 MCG (2000 UT) CAPS Take 1 capsule (2,000 Units total) by mouth daily. 90 capsule 3   ipratropium (ATROVENT ) 0.03 % nasal spray PLACE 2 SPRAYS INTO BOTH NOSTRILS EVERY 12 (TWELVE) HOURS. 30 mL 1   losartan  (COZAAR ) 100 MG tablet TAKE 1 TABLET BY MOUTH EVERY DAY 90 tablet 0   aspirin  EC 81 MG tablet Take 1 tablet (81 mg total) by mouth daily. Swallow whole. (Patient not taking: Reported on 09/03/2023) 90 tablet 3   tiZANidine  (ZANAFLEX ) 4 MG tablet TAKE 1 TABLET (4 MG TOTAL) BY MOUTH 2 (TWO) TIMES DAILY AS NEEDED FOR MUSCLE SPASMS. (Patient not  taking: Reported on 09/03/2023) 60 tablet 1   No current facility-administered medications on file prior to visit.     The following portions of the patient's history were reviewed and updated as appropriate: allergies, current medications, past family history, past medical history, past social history, past surgical history and problem list.  ROS Otherwise as in subjective above    Objective: BP 124/82   Pulse 70   Temp 98 F (36.7 C)   Wt 202 lb 6.4 oz (91.8 kg)   BMI 29.04 kg/m   General appearance: alert, no distress, well developed, well nourished HEENT: normocephalic, sclerae anicteric, conjunctiva pink and moist, TMs pearly, nares patent, no discharge or erythema, pharynx normal Oral cavity: MMM, no lesions Neck: supple, no lymphadenopathy, no thyromegaly, no masses Heart: RRR, normal S1, S2, no murmurs Lungs: CTA bilaterally, no wheezes, rhonchi, or rales Abdomen: +bs, soft, non tender, non distended, no masses, no hepatomegaly, no splenomegaly Pulses: 2+ radial pulses, 2+ pedal pulses, normal cap refill Ext: no edema   Assessment: Encounter Diagnoses  Name Primary?   Chronic cough Yes   Runny nose    Mild persistent reactive airway disease without complication    Purulent nasal discharge      Plan: Recommendations: Begin 3-day round of Z-Pak antibiotic Begin  3-day round of prednisone  steroid by mouth I want you to use an inhaler called Symbicort  2 puffs in the morning 2 puffs in the afternoon for the next 2 to 3 weeks.  Rinse your mouth out with use.  This medicine is meant to reduce inflammation in the lungs and hopefully help with the cough its.  It is a long-acting medication. I also prescribed Tessalon  Perle cough drops to you can use 2 or 3 times daily as needed Make sure you are drinking plenty of water throughout the day I also prescribed a different nasal spray called Qnasl  beclomethasone.  We will see if we can get this covered by insurance since you  have tried other nasal sprays prior.  You could use this in conjunction with continuing your ipratropium Atrovent  nasal spray as well I will place a referral back to ENT If you do not feel much better with the cough by Tuesday or Wednesday of next week then I need to have you go get a chest x-ray.  To call back if you do not see significant improvement by middle of next week   awab abebe was seen today for cough.  Diagnoses and all orders for this visit:  Chronic cough  Runny nose  Mild persistent reactive airway disease without complication  Purulent nasal discharge  Other orders -     Beclomethasone Dipropionate  80 MCG/ACT AERS; Place 1 spray into the nose 2 (two) times daily. -     azithromycin  (ZITHROMAX ) 500 MG tablet; 1 tablet po daily x 3 days -     benzonatate  (TESSALON ) 100 MG capsule; Take 1 capsule (100 mg total) by mouth 2 (two) times daily as needed for cough. -     budesonide -formoterol  (SYMBICORT ) 160-4.5 MCG/ACT inhaler; Inhale 2 puffs into the lungs 2 (two) times daily. -     predniSONE  (DELTASONE ) 20 MG tablet; 3 tablets today, 2 tablets tomorrow, 1 tablet the third day    Follow up: prn

## 2023-09-03 NOTE — Patient Instructions (Signed)
 Recommendations: Begin 3-day round of Z-Pak antibiotic Begin 3-day round of prednisone  steroid by mouth I want you to use an inhaler called Symbicort  2 puffs in the morning 2 puffs in the afternoon for the next 2 to 3 weeks.  Rinse your mouth out with use.  This medicine is meant to reduce inflammation in the lungs and hopefully help with the cough its.  It is a long-acting medication. I also prescribed Tessalon  Perle cough drops to you can use 2 or 3 times daily as needed Make sure you are drinking plenty of water throughout the day I also prescribed a different nasal spray called Qnasl  beclomethasone.  We will see if we can get this covered by insurance since you have tried other nasal sprays prior.  You could use this in conjunction with continuing your ipratropium Atrovent  nasal spray as well I will place a referral back to ENT If you do not feel much better with the cough by Tuesday or Wednesday of next week then I need to have you go get a chest x-ray.  To call back if you do not see significant improvement by middle of next week

## 2023-09-06 ENCOUNTER — Other Ambulatory Visit (HOSPITAL_COMMUNITY): Payer: Self-pay

## 2023-09-06 ENCOUNTER — Telehealth: Payer: Self-pay

## 2023-09-06 NOTE — Telephone Encounter (Signed)
 Pharmacy Patient Advocate Encounter   Received notification from Physician's Office that prior authorization for QNASL  AER is required/requested.   Insurance verification completed.   The patient is insured through [facility] .   Per test claim: PA required; PA submitted to above mentioned insurance via Fax Key/confirmation #/[id number]   Status is pending

## 2023-09-06 NOTE — Telephone Encounter (Signed)
 This Prior Authorization form has been sent off as ''urgent'' and is currently pending

## 2023-09-08 ENCOUNTER — Encounter: Payer: Self-pay | Admitting: Internal Medicine

## 2023-09-08 ENCOUNTER — Other Ambulatory Visit (HOSPITAL_COMMUNITY): Payer: Self-pay

## 2023-09-08 ENCOUNTER — Telehealth: Payer: Self-pay

## 2023-09-08 NOTE — Telephone Encounter (Signed)
Pharmacy Patient Advocate Encounter  Received notification from [facility] that Prior Authorization for QNASL has been DENIED.  Full denial letter will be uploaded to the media tab. See denial reason below.        -Provider has been made aware via RX REQ MSGS

## 2023-09-08 NOTE — Telephone Encounter (Signed)
UPDATE:  I have spoke with the pts plan and addt info was needed from th einsurance which I have now provided as of 2/12. Per the plan a determination will be made in 24hrs  Case id# [id number]  Please note  that if this is approved then the dispense qty will need to be changed from 8.7 to 10.6 as this is the only available dosage-per insurance

## 2023-09-08 NOTE — Telephone Encounter (Signed)
P/a has been denied. Please note all trial and failures of previous therapies were included.  The pts plan is asking that the pt has trialed and failed (3) of the listed drugs below.

## 2023-09-09 ENCOUNTER — Other Ambulatory Visit: Payer: Self-pay | Admitting: Medical

## 2023-09-09 MED ORDER — MOMETASONE FUROATE 50 MCG/ACT NA SUSP
2.0000 | Freq: Every day | NASAL | 0 refills | Status: DC
Start: 2023-09-09 — End: 2023-10-04

## 2023-09-09 NOTE — Progress Notes (Signed)
Jackson Donaldson

## 2023-09-09 NOTE — Telephone Encounter (Signed)
Patient states he has already tried over the counter flonase before and didn't help. He is willing to try Nasonex to see if this will work. Please send in

## 2023-09-12 ENCOUNTER — Other Ambulatory Visit: Payer: Self-pay | Admitting: Medical

## 2023-09-27 ENCOUNTER — Other Ambulatory Visit: Payer: Self-pay | Admitting: Medical

## 2023-09-29 ENCOUNTER — Other Ambulatory Visit: Payer: Self-pay | Admitting: Medical

## 2023-09-30 ENCOUNTER — Other Ambulatory Visit: Payer: Self-pay | Admitting: Medical

## 2023-10-02 ENCOUNTER — Other Ambulatory Visit: Payer: Self-pay | Admitting: Medical

## 2023-11-12 ENCOUNTER — Ambulatory Visit: Payer: Self-pay

## 2023-11-12 NOTE — Telephone Encounter (Signed)
 Copied from CRM [id number]. Topic: Clinical - Red Word Triage >> Nov 12, 2023  9:44 AM Leory Rands wrote: Red Word that prompted transfer to Nurse Triage: Patient is calling to report hernia pain in his belly pain that comes and goes. When he pushes it back in it hurts. He pushes it in 20 times a day.  Chief Complaint: abd pain Symptoms: abd pain located at belly button - hernia Frequency: ongoing and worsening over several months Pertinent Negatives: Patient denies fever Disposition: [] ED /[] Urgent Care (no appt availability in office) / [x] Appointment(In office/virtual)/ []  [facility]/ [] Home Care/ [] Refused Recommended Disposition /[] [facility]/ []  Follow-up with PCP Additional Notes: pt stated diagnosed with hernia in belly button years ago.  Pt constantly pushes area in to keep it from appearing through clothing. when push area in produces 5/10 pain: pt would like to be elevated and tx by PCP.  Reason for Disposition  Abdominal pain is a chronic symptom (recurrent or ongoing AND present > 4 weeks)  Answer Assessment - Initial Assessment Questions 1. LOCATION: "Where does it hurt?"      Belly button - hernia 2. RADIATION: "Does the pain shoot anywhere else?" (e.g., chest, back)     no 3. ONSET: "When did the pain begin?" (Minutes, hours or days ago)      ongoing 4. SUDDEN: "Gradual or sudden onset?"     gradual 5. PATTERN "Does the pain come and go, or is it constant?"    - If it comes and goes: "How long does it last?" "Do you have pain now?"     (Note: Comes and goes means the pain is intermittent. It goes away completely between bouts.)    - If constant: "Is it getting better, staying the same, or getting worse?"      (Note: Constant means the pain never goes away completely; most serious pain is constant and gets worse.)      Pain comes only when pushing on hernia 6. SEVERITY: "How bad is the pain?"  (e.g., Scale 1-10; mild, moderate, or severe)    - MILD  (1-3): Doesn't interfere with normal activities, abdomen soft and not tender to touch.     - MODERATE (4-7): Interferes with normal activities or awakens from sleep, abdomen tender to touch.     - SEVERE (8-10): Excruciating pain, doubled over, unable to do any normal activities.   moderate 7. RECURRENT SYMPTOM: "Have you ever had this type of stomach pain before?" If Yes, ask: "When was the last time?" and "What happened that time?"      yes 8. CAUSE: "What do you think is causing the stomach pain?"     hernia 9. RELIEVING/AGGRAVATING FACTORS: "What makes it better or worse?" (e.g., antacids, bending or twisting motion, bowel movement)     Pushing the hernia in 5/10  10. OTHER SYMPTOMS: "Do you have any other symptoms?" (e.g., back pain, diarrhea, fever, urination pain, vomiting)       N/a  Protocols used: Abdominal Pain - Male-A-AH

## 2023-11-15 NOTE — Telephone Encounter (Signed)
 Pt has an appt on 4/24

## 2023-11-18 ENCOUNTER — Encounter: Payer: Self-pay | Admitting: Medical

## 2023-11-18 ENCOUNTER — Ambulatory Visit (INDEPENDENT_AMBULATORY_CARE_PROVIDER_SITE_OTHER): Admitting: Medical

## 2023-11-18 VITALS — BP 132/86 | HR 66 | Wt 204.0 lb

## 2023-11-18 DIAGNOSIS — R9389 Abnormal findings on diagnostic imaging of other specified body structures: Secondary | ICD-10-CM

## 2023-11-18 DIAGNOSIS — I10 Essential (primary) hypertension: Secondary | ICD-10-CM | POA: Diagnosis not present

## 2023-11-18 DIAGNOSIS — K429 Umbilical hernia without obstruction or gangrene: Secondary | ICD-10-CM

## 2023-11-18 DIAGNOSIS — M5441 Lumbago with sciatica, right side: Secondary | ICD-10-CM | POA: Diagnosis not present

## 2023-11-18 DIAGNOSIS — R0602 Shortness of breath: Secondary | ICD-10-CM | POA: Diagnosis not present

## 2023-11-18 DIAGNOSIS — G8929 Other chronic pain: Secondary | ICD-10-CM

## 2023-11-18 DIAGNOSIS — M5442 Lumbago with sciatica, left side: Secondary | ICD-10-CM

## 2023-11-18 MED ORDER — TIZANIDINE HCL 4 MG PO TABS: 4.0000 mg | ORAL_TABLET | Freq: Two times a day (BID) | ORAL | 1 refills | Status: AC | PRN

## 2023-11-18 MED ORDER — LOSARTAN POTASSIUM 100 MG PO TABS: 100.0000 mg | ORAL_TABLET | Freq: Every day | ORAL | 0 refills | Status: DC

## 2023-11-18 NOTE — Progress Notes (Signed)
 Subjective:  Jackson Donaldson is a 63 y.o. male who presents for Chief Complaint  Patient presents with   Acute Visit    Acute visit. Abd pain. Hernia.    other    Pt. Stated doesn't have pain only when he push the hernia back in that is in his belly button area, it pops out all the time and he pushes it back in     Here for a few concerns.  He has a history of umbilical hernia but lately is causing him discomfort quite regularly.  Sometimes it feels hard, sometimes is hard to push back in.  He does feel pain at the hernia quite regularly compared to in the past.  No other abdominal pain noted.  He needs a refill on his blood pressure medicine.  He also needs refill of his muscle laxer he uses periodically  He has a concern for occasional short of breath.  No chest pain, no palpitations, no edema.  He just feels less stamina may be done in the past.  He has a history of tobacco use briefly when he was much younger in his younger years, smoked for probably about 6 months only ever.  He is an Journalist, newspaper and works around [employer]  No other aggravating or relieving factors.    No other c/o.  Past Medical History:  Diagnosis Date   Anemia    Essential hypertension, benign 07/08/2020   GERD (gastroesophageal reflux disease)    Iron deficiency anemia    Kidney stones    Current Outpatient Medications on File Prior to Visit  Medication Sig Dispense Refill   losartan  (COZAAR ) 100 MG tablet TAKE 1 TABLET BY MOUTH EVERY DAY 90 tablet 0   tiZANidine  (ZANAFLEX ) 4 MG tablet TAKE 1 TABLET (4 MG TOTAL) BY MOUTH 2 (TWO) TIMES DAILY AS NEEDED FOR MUSCLE SPASMS. 60 tablet 1   No current facility-administered medications on file prior to visit.    The following portions of the patient's history were reviewed and updated as appropriate: allergies, current medications, past family history, past medical history, past social history, past surgical history and problem  list.  ROS Otherwise as in subjective above     Objective: BP 132/86   Pulse 66   Wt 204 lb (92.5 kg)   BMI 29.27 kg/m   General appearance: alert, no distress, well developed, well nourished HEENT: normocephalic, sclerae anicteric, conjunctiva pink and moist, TMs pearly, nares patent, no discharge or erythema, pharynx normal Oral cavity: MMM, no lesions Neck: supple, no lymphadenopathy, no thyromegaly, no masses Heart: RRR, normal S1, S2, no murmurs Lungs: CTA bilaterally, no wheezes, rhonchi, or rales Abdomen: +bs, soft, moderate size reducible umbilical hernia, otherwise non tender, non distended, no masses, no hepatomegaly, no splenomegaly Pulses: 2+ radial pulses, 2+ pedal pulses, normal cap refill Ext: no edema  CT cardiac score 09/28/22 IMPRESSION: 1. Mild bronchial thickening with heterogeneous pulmonary parenchyma suggesting of chronic small airways disease. 2. Moderate-sized hiatal hernia.    IMPRESSION: Coronary calcium  score of 60.4. This was 54th percentile for age-, race-, and sex-matched controls.     Assessment: Encounter Diagnoses  Name Primary?   Umbilical hernia without obstruction and without gangrene Yes   SOB (shortness of breath)    Essential hypertension    Abnormal CT of the chest    Chronic bilateral low back pain with sciatica, sciatica laterality unspecified      Plan: Umbilical hernia with frequent discomfort-referral to general surgery  Shortness of breath-we discussed his mild symptoms but given his CT findings showing some unusual findings last year we will refer to pulmonology for consult  EKG reviewed today, normal  Hypertension-continue current medication losartan  100 mg daily  Chronic back pain, muscle spasm-can use tizanidine  as needed    Truong "joe" was seen today for acute visit and other.  Diagnoses and all orders for this visit:  Umbilical hernia without obstruction and without gangrene -     Ambulatory  referral to General Surgery  SOB (shortness of breath) -     Ambulatory referral to Pulmonology -     EKG 12-Lead  Essential hypertension  Abnormal CT of the chest -     Ambulatory referral to Pulmonology  Chronic bilateral low back pain with sciatica, sciatica laterality unspecified    Follow up: pending referral, return soon for well visit

## 2023-11-24 ENCOUNTER — Encounter: Payer: Self-pay | Admitting: Medical

## 2023-11-24 ENCOUNTER — Ambulatory Visit (INDEPENDENT_AMBULATORY_CARE_PROVIDER_SITE_OTHER): Admitting: Medical

## 2023-11-24 VITALS — BP 112/72 | HR 69 | Ht 70.0 in | Wt 199.8 lb

## 2023-11-24 DIAGNOSIS — E785 Hyperlipidemia, unspecified: Secondary | ICD-10-CM | POA: Diagnosis not present

## 2023-11-24 DIAGNOSIS — R7989 Other specified abnormal findings of blood chemistry: Secondary | ICD-10-CM | POA: Diagnosis not present

## 2023-11-24 DIAGNOSIS — K429 Umbilical hernia without obstruction or gangrene: Secondary | ICD-10-CM

## 2023-11-24 DIAGNOSIS — Z125 Encounter for screening for malignant neoplasm of prostate: Secondary | ICD-10-CM

## 2023-11-24 DIAGNOSIS — Z0000 Encounter for general adult medical examination without abnormal findings: Secondary | ICD-10-CM

## 2023-11-24 DIAGNOSIS — L989 Disorder of the skin and subcutaneous tissue, unspecified: Secondary | ICD-10-CM

## 2023-11-24 DIAGNOSIS — G4733 Obstructive sleep apnea (adult) (pediatric): Secondary | ICD-10-CM | POA: Diagnosis not present

## 2023-11-24 DIAGNOSIS — Z1389 Encounter for screening for other disorder: Secondary | ICD-10-CM

## 2023-11-24 DIAGNOSIS — R5383 Other fatigue: Secondary | ICD-10-CM

## 2023-11-24 LAB — LIPID PANEL

## 2023-11-24 NOTE — Progress Notes (Signed)
 I have sent message to Josefine Nice at [facility] to get CPAP compliance.

## 2023-11-24 NOTE — Progress Notes (Addendum)
 Subjective:   HPI  Jackson Donaldson is a 63 y.o. male who presents for Chief Complaint  Patient presents with   Annual Exam    Fasting cpe, no concerns. Declines shingrix and pneumon shot    Patient Care Team: Anissia Wessells, Christiane Cowing, PA-C as PCP - General (Family Medicine) Dr. Lorella Roles, GI Dr. Drucilla Georgis, ENT Dentist Eye doctor   Concerns: Here for well visit today, fasting  I saw him last week for concern about hernia.  We referred him to general surgery  He was on aspirin  before but ran out.  He also quit his cholesterol medicine before as he did not like the way it made him feel  He does not have a history of hemorrhoids, history of blood in the stool occasionally when the hemorrhoids are flared up after eating spicy foods.    OSA-compliant with CPAP.  Often that he still feels fatigued.  He knows the CPAP helps with the snoring but he still gets fatigue.  He wonders if he needs a sleep aid as he has trouble sleeping as well  He has a history of low testosterone  and had tried injections before IM but no other types of treatments.  Injections did not seem to help.   Reviewed their medical, surgical, family, social, medication, and allergy history and updated chart as appropriate.  Allergies  Allergen Reactions   Lisinopril  Rash    Past Medical History:  Diagnosis Date   Anemia    Essential hypertension, benign 07/08/2020   GERD (gastroesophageal reflux disease)    Kidney stones     Current Outpatient Medications on File Prior to Visit  Medication Sig Dispense Refill   Azelastine  HCl 137 MCG/SPRAY SOLN Place 2 sprays into the nose in the morning and at bedtime.     ipratropium (ATROVENT ) 0.06 % nasal spray Place 2 sprays into the nose 4 (four) times daily.     losartan  (COZAAR ) 100 MG tablet Take 1 tablet (100 mg total) by mouth daily. 90 tablet 0   tiZANidine  (ZANAFLEX ) 4 MG tablet Take 1 tablet (4 mg total) by mouth 2 (two) times daily as needed for muscle  spasms. 60 tablet 1   No current facility-administered medications on file prior to visit.      Current Outpatient Medications:    Azelastine  HCl 137 MCG/SPRAY SOLN, Place 2 sprays into the nose in the morning and at bedtime., Disp: , Rfl:    ipratropium (ATROVENT ) 0.06 % nasal spray, Place 2 sprays into the nose 4 (four) times daily., Disp: , Rfl:    losartan  (COZAAR ) 100 MG tablet, Take 1 tablet (100 mg total) by mouth daily., Disp: 90 tablet, Rfl: 0   tiZANidine  (ZANAFLEX ) 4 MG tablet, Take 1 tablet (4 mg total) by mouth 2 (two) times daily as needed for muscle spasms., Disp: 60 tablet, Rfl: 1  Family History  Problem Relation Age of Onset   Diabetes Mother    Breast cancer Sister    Lung cancer Sister    Colon cancer Neg Hx    Esophageal cancer Neg Hx    Rectal cancer Neg Hx     Past Surgical History:  Procedure Laterality Date   COLONOSCOPY  10/2019   Dr. Lorella Roles, repeat 10 years   NASAL SEPTOPLASTY W/ TURBINOPLASTY Bilateral 01/30/2020   Procedure: NASAL SEPTOPLASTY WITH TURBINATE REDUCTION;  Surgeon: Prescott Brodie, MD;  Location: [facility];  Service: ENT;  Laterality: Bilateral;   RHINOPLASTY N/A 01/30/2020  Procedure: RHINOPLASTY;  Surgeon: Prescott Brodie, MD;  Location: [facility];  Service: ENT;  Laterality: N/A;     Review of Systems  Constitutional:  Positive for malaise/fatigue. Negative for chills, fever and weight loss.  HENT:  Negative for congestion, ear pain, hearing loss, sore throat and tinnitus.   Eyes:  Negative for blurred vision, pain and redness.  Respiratory:  Negative for cough, hemoptysis and shortness of breath.   Cardiovascular:  Negative for chest pain, palpitations, orthopnea, claudication and leg swelling.  Gastrointestinal:  Negative for abdominal pain, blood in stool, constipation, diarrhea, nausea and vomiting.  Genitourinary:  Negative for dysuria, flank pain, frequency, hematuria and  urgency.  Musculoskeletal:  Negative for falls, joint pain and myalgias.  Skin:  Negative for itching and rash.  Neurological:  Negative for dizziness, tingling, speech change, weakness and headaches.  Endo/Heme/Allergies:  Negative for polydipsia. Does not bruise/bleed easily.  Psychiatric/Behavioral:  Negative for depression and memory loss. The patient is not nervous/anxious and does not have insomnia.         Objective:  BP 112/72   Pulse 69   Ht 5\' 10"  (1.778 m)   Wt 199 lb 12.8 oz (90.6 kg)   SpO2 98%   BMI 28.67 kg/m   General appearance: alert, no distress, WD/WN, Caucasian male Skin: Scab on top of scalp from recent abrasion, scattered macules throughout, crusted lesion right superior lobe HEENT: normocephalic, conjunctiva/corneas normal, sclerae anicteric, PERRLA, EOMi, nares patent, no discharge or erythema, pharynx normal Oral cavity: MMM, tongue normal, teeth normal Neck: supple, no lymphadenopathy, no thyromegaly, no masses, normal ROM, no bruits Chest: non tender, normal shape and expansion Heart: RRR, normal S1, S2, no murmurs Lungs: CTA bilaterally, no wheezes, rhonchi, or rales Abdomen: +bs, soft, non tender, non distended, no masses, no hepatomegaly, no splenomegaly, no bruits Back: non tender, normal ROM, no scoliosis Musculoskeletal: upper extremities non tender, no obvious deformity, normal ROM throughout, lower extremities non tender, no obvious deformity, normal ROM throughout Extremities: no edema, no cyanosis, no clubbing Pulses: 2+ symmetric, upper and lower extremities, normal cap refill Neurological: alert, oriented x 3, CN2-12 intact, strength normal upper extremities and lower extremities, sensation normal throughout, DTRs 2+ throughout, no cerebellar signs, gait normal Psychiatric: normal affect, behavior normal, pleasant  GU: Deferred Rectal: Declined    Assessment and Plan :   Encounter Diagnoses  Name Primary?   Encounter for health  maintenance examination in adult Yes   Hyperlipidemia, unspecified hyperlipidemia type    Low testosterone     Fatigue, unspecified type    Screening for prostate cancer    Screening for hematuria or proteinuria    Umbilical hernia without obstruction and without gangrene    Skin lesion    OSA (obstructive sleep apnea)     This visit was a preventative care visit, also known as wellness visit or routine physical.   Topics typically include healthy lifestyle, diet, exercise, preventative care, vaccinations, sick and well care, proper use of emergency dept and after hours care, as well as other concerns.     Separate significant issues discussed:  Hyperlipidemia-not compliant with statin.  Prior side effects reported.  Recheck labs today.  Consider other medication options.  Go back on aspirin  81 mg daily  Low testosterone -not currently on treatment.  He does note fatigue.  Prior did send injectable IM medication but did not get response with this.  Consider other treatment options.  Fatigue-discussed possible causes.  Updated labs today.  umbilical hernia-referred last week to general surgery due to increasing frequency of pain at the hernia site  Referral to dermatology for skin surveillance and skin lesions  OSA- we will request CPAP compliance report   General Recommendations: Continue to return yearly for your annual wellness and preventative care visits.  This gives us  a chance to discuss healthy lifestyle, exercise, vaccinations, review your chart record, and perform screenings where appropriate.  I recommend you see your eye doctor yearly for routine vision care.  I recommend you see your dentist yearly for routine dental care including hygiene visits twice yearly.   Vaccination  Immunization History  Administered Date(s) Administered   Tdap 01/03/2020    Vaccine recommendations: Advised shingles vaccine, yearly flu shot, pneumococcal vaccine.  He declines all  3.   Screening for cancer: Colon cancer screening: Prior or last colon cancer screen: April 2021 reviewed.  Due repeat in 10 years  Prostate Cancer screening: The recommended prostate cancer screening test is a blood test called the prostate-specific antigen (PSA) test. PSA is a protein that is made in the prostate. As you age, your prostate naturally produces more PSA. Abnormally high PSA levels may be caused by: Prostate cancer. An enlarged prostate that is not caused by cancer (benign prostatic hyperplasia, or BPH). This condition is very common in older men. A prostate gland infection (prostatitis) or urinary tract infection. Certain medicines such as male hormones (like testosterone ) or other medicines that raise testosterone  levels. A rectal exam may be done as part of prostate cancer screening to help provide information about the size of your prostate gland. When a rectal exam is performed, it should be done after the PSA level is drawn to avoid any effect on the results.   Skin cancer screening: Check your skin regularly for new changes, growing lesions, or other lesions of concern Come in for evaluation if you have skin lesions of concern.   Lung cancer screening: If you have a greater than 20 pack year history of tobacco use, then you may qualify for lung cancer screening with a chest CT scan.   Please call your insurance company to inquire about coverage for this test.   Pancreatic cancer:  no current screening test is available or routinely recommended. (risk factors: smoking, overweight or obese, diabetes, chronic pancreatitis, work exposure - dry cleaning, metal working, 63yo>, M>F, Tree surgeon, family hx/o, hereditary breast, ovarian, melanoma, lynch, peutz-jeghers).  Symptoms: jaundice, dark urine, light color or greasy stools, itchy skin, belly or back pain, weight loss, poor appetite, nause, vomiting, liver enlargement, DVT/blood clots.   We currently don't have  screenings for other cancers besides breast, cervical, colon, and lung cancers.  If you have a strong family history of cancer or have other cancer screening concerns, please let me know.  Genetic testing referral is an option for individuals with high cancer risk in the family.  There are some other cancer screenings in development currently.   Bone health: Get at least 150 minutes of aerobic exercise weekly Get weight bearing exercise at least once weekly Bone density test:  A bone density test is an imaging test that uses a type of X-ray to measure the amount of calcium  and other minerals in your bones. The test may be used to diagnose or screen you for a condition that causes weak or thin bones (osteoporosis), predict your risk for a broken bone (fracture), or determine how well your osteoporosis treatment is working. The bone density test is recommended  for females 60 and older, or females or males <65 if certain risk factors such as thyroid  disease, long term use of steroids such as for asthma or rheumatological issues, vitamin D  deficiency, estrogen deficiency, family history of osteoporosis, self or family history of fragility fracture in first degree relative.    Heart health: Get at least 150 minutes of aerobic exercise weekly Limit alcohol It is important to maintain a healthy blood pressure and healthy cholesterol numbers  Heart disease screening: Screening for heart disease includes screening for blood pressure, fasting lipids, glucose/diabetes screening, BMI height to weight ratio, reviewed of smoking status, physical activity, and diet.    Goals include blood pressure 120/80 or less, maintaining a healthy lipid/cholesterol profile, preventing diabetes or keeping diabetes numbers under good control, not smoking or using tobacco products, exercising most days per week or at least 150 minutes per week of exercise, and eating healthy variety of fruits and vegetables, healthy oils, and  avoiding unhealthy food choices like fried food, fast food, high sugar and high cholesterol foods.    Other tests may possibly include EKG test, CT coronary calcium  score, echocardiogram, exercise treadmill stress test.     CT coronary calcium  test September 28, 2022 score 60.4 and there was some moderate bronchial thickening with heterogeneous pulmonary parenchyma suggesting small airway disease and moderate size hiatal hernia    Vascular disease screening: For higher risk individuals including smokers, diabetics, patients with known heart disease or high blood pressure, kidney disease, and others, screening for vascular disease or atherosclerosis of the arteries is available.  Examples may include carotid ultrasound, abdominal aortic ultrasound, ABI blood flow screening in the legs, thoracic aorta screening.    Medical care options: I recommend you continue to seek care here first for routine care.  We try really hard to have available appointments Monday through Friday daytime hours for sick visits, acute visits, and physicals.  Urgent care should be used for after hours and weekends for significant issues that cannot wait till the next day.  The emergency department should be used for significant potentially life-threatening emergencies.  The emergency department is expensive, can often have long wait times for less significant concerns, so try to utilize primary care, urgent care, or telemedicine when possible to avoid unnecessary trips to the emergency department.  Virtual visits and telemedicine have been introduced since the pandemic started in 2020, and can be convenient ways to receive medical care.  We offer virtual appointments as well to assist you in a variety of options to seek medical care.   Legal  Take the time to do a last will and testament, Advanced Directives including Health Care Power of Attorney and Living Will documents.  Don't leave your family with burdens that can be handled  ahead of time.   Advanced Directives: I recommend you consider completing a Health Care Power of Attorney and Living Will.   These documents respect your wishes and help alleviate burdens on your loved ones if you were to become terminally ill or be in a position to need those documents enforced.    You can complete Advanced Directives yourself, have them notarized, then have copies made for our office, for you and for anybody you feel should have them in safe keeping.  Or, you can have an attorney prepare these documents.   If you haven't updated your Last Will and Testament in a while, it may be worthwhile having an attorney prepare these documents together and save on some  costs.       Spiritual and Emotional Health Keeping a healthy spiritual life can help you better manage your physical health. Your spiritual life can help you to cope with any issues that may arise with your physical health.  Balance can keep us  healthy and help us  to recover.  If you are struggling with your spiritual health there are questions that you may want to ask yourself:  What makes me feel most complete? When do I feel most connected to the rest of the world? Where do I find the most inner strength? What am I doing when I feel whole?  Helpful tips: Being in nature. Some people feel very connected and at peace when they are walking outdoors or are outside. Helping others. Some feel the largest sense of wellbeing when they are of service to others. Being of service can take on many forms. It can be doing volunteer work, being kind to strangers, or offering a hand to a friend in need. Gratitude. Some people find they feel the most connected when they remain grateful. They may make lists of all the things they are grateful for or say a thank you out loud for all they have.    Emotional Health Are you in tune with your emotional health?  Check out this link: http://www.marquez-love.com/    Financial  Health Make sure you use a budget for your personal finances Make sure you are insured against risks (health insurance, life insurance, auto insurance, etc) Save more, spend less Set financial goals If you need help in this area, good resources include counseling through [employer] or other community resources, have a meeting with a Social research officer, government, and a good resource is the [employer] "joe" was seen today for annual exam.  Diagnoses and all orders for this visit:  Encounter for health maintenance examination in adult -     Comprehensive metabolic panel with GFR -     CBC with Differential/Platelet -     Lipid panel -     PSA -     Testosterone  -     Vitamin B12 -     Urinalysis, Routine w reflex microscopic -     TSH + free T4  Hyperlipidemia, unspecified hyperlipidemia type -     Lipid panel  Low testosterone  -     Testosterone   Fatigue, unspecified type -     CBC with Differential/Platelet -     Testosterone  -     Vitamin B12 -     TSH + free T4  Screening for prostate cancer -     PSA  Screening for hematuria or proteinuria -     Urinalysis, Routine w reflex microscopic  Umbilical hernia without obstruction and without gangrene  Skin lesion -     Ambulatory referral to Dermatology  OSA (obstructive sleep apnea)    Follow-up pending labs, yearly for physical

## 2023-11-25 LAB — CBC WITH DIFFERENTIAL/PLATELET
Basophils Absolute: 0.1 10*3/uL (ref 0.0–0.2)
Basos: 1 %
EOS (ABSOLUTE): 0.2 10*3/uL (ref 0.0–0.4)
Eos: 4 %
Hematocrit: 42.5 % (ref 37.5–51.0)
Hemoglobin: 13.7 g/dL (ref 13.0–17.7)
Immature Grans (Abs): 0 10*3/uL (ref 0.0–0.1)
Immature Granulocytes: 0 %
Lymphocytes Absolute: 1.4 10*3/uL (ref 0.7–3.1)
Lymphs: 31 %
MCH: 28.8 pg (ref 26.6–33.0)
MCHC: 32.2 g/dL (ref 31.5–35.7)
MCV: 89 fL (ref 79–97)
Monocytes Absolute: 0.4 10*3/uL (ref 0.1–0.9)
Monocytes: 10 %
Neutrophils Absolute: 2.5 10*3/uL (ref 1.4–7.0)
Neutrophils: 54 %
Platelets: 227 10*3/uL (ref 150–450)
RBC: 4.76 x10E6/uL (ref 4.14–5.80)
RDW: 13.7 % (ref 11.6–15.4)
WBC: 4.6 10*3/uL (ref 3.4–10.8)

## 2023-11-25 LAB — MICROSCOPIC EXAMINATION
Bacteria, UA: NONE SEEN
Casts: NONE SEEN /LPF
Epithelial Cells (non renal): NONE SEEN /HPF (ref 0–10)
RBC, Urine: NONE SEEN /HPF (ref 0–2)
WBC, UA: NONE SEEN /HPF (ref 0–5)

## 2023-11-25 LAB — COMPREHENSIVE METABOLIC PANEL WITH GFR
ALT: 110 IU/L — ABNORMAL HIGH (ref 0–44)
AST: 96 IU/L — ABNORMAL HIGH (ref 0–40)
Albumin: 4.4 g/dL (ref 3.9–4.9)
Alkaline Phosphatase: 101 IU/L (ref 44–121)
BUN/Creatinine Ratio: 18 (ref 10–24)
BUN: 25 mg/dL (ref 8–27)
Bilirubin Total: 0.7 mg/dL (ref 0.0–1.2)
CO2: 18 mmol/L — ABNORMAL LOW (ref 20–29)
Calcium: 9.9 mg/dL (ref 8.6–10.2)
Chloride: 104 mmol/L (ref 96–106)
Creatinine, Ser: 1.36 mg/dL — ABNORMAL HIGH (ref 0.76–1.27)
Globulin, Total: 4.5 g/dL (ref 1.5–4.5)
Glucose: 105 mg/dL — ABNORMAL HIGH (ref 70–99)
Potassium: 4.4 mmol/L (ref 3.5–5.2)
Sodium: 136 mmol/L (ref 134–144)
Total Protein: 8.9 g/dL — ABNORMAL HIGH (ref 6.0–8.5)
eGFR: 58 mL/min/{1.73_m2} — ABNORMAL LOW (ref >59)

## 2023-11-25 LAB — LIPID PANEL
Cholesterol, Total: 187 mg/dL (ref 100–199)
HDL: 54 mg/dL (ref >39)
LDL CALC COMMENT:: 3.5 ratio (ref 0.0–5.0)
LDL Chol Calc (NIH): 120 mg/dL — ABNORMAL HIGH (ref 0–99)
Triglycerides: 69 mg/dL (ref 0–149)
VLDL Cholesterol Cal: 13 mg/dL (ref 5–40)

## 2023-11-25 LAB — URINALYSIS, ROUTINE W REFLEX MICROSCOPIC
Bilirubin, UA: NEGATIVE
Glucose, UA: NEGATIVE
Ketones, UA: NEGATIVE
Leukocytes,UA: NEGATIVE
Nitrite, UA: NEGATIVE
RBC, UA: NEGATIVE
Specific Gravity, UA: 1.018 (ref 1.005–1.030)
Urobilinogen, Ur: 0.2 mg/dL (ref 0.2–1.0)
pH, UA: 6 (ref 5.0–7.5)

## 2023-11-25 LAB — PSA: Prostate Specific Ag, Serum: 0.9 ng/mL (ref 0.0–4.0)

## 2023-11-25 LAB — TSH+FREE T4
Free T4: 1.33 ng/dL (ref 0.82–1.77)
TSH: 1.15 u[IU]/mL (ref 0.450–4.500)

## 2023-11-25 LAB — VITAMIN B12: Vitamin B-12: 283 pg/mL (ref 232–1245)

## 2023-11-25 LAB — TESTOSTERONE: Testosterone: 246 ng/dL — ABNORMAL LOW (ref 264–916)

## 2023-11-25 NOTE — Progress Notes (Signed)
 Have lab add Hep B surface antigen.    Results:  Labs show some elevated protein, kidney marker low abnormal, liver test definitely elevated, cholesterol not at goal, testosterone  low.  Blood counts normal, prostate marker normal, thyroid  marker normal.  Still pending B12.  Remind me what was the side effect you had with the prior cholesterol medication?  Recommendations: 1-you should be back on some type of cholesterol medication.   Are you agreeable to trial of different one? 2-we can consider testosterone  therapy.   You may want to call your insurance to see which products of testosterone  they would cover.  There are topical gels such as Androgel , Testim , Fortesta , subcutaneous weekly injection called Xyosted, oral Jatenzo, nasal spray Natesto , and testosterone  cypionate muscular injection.  3- limit or cut back on alcohol given the liver tests.  I recommend a liver ultrasound to further check the liver.  Are you agreeable to this? 4 - regarding kidney and protein, lets have you come by for repeat lab (CM[facility]) in 8 weeks when you haven't drank alcohol in a few days.    5- drink at least 100 ounces of water daily.

## 2023-11-25 NOTE — Progress Notes (Signed)
 Results sent through MyChart

## 2023-11-29 ENCOUNTER — Other Ambulatory Visit: Payer: Self-pay | Admitting: Medical

## 2023-11-29 MED ORDER — JATENZO 237 MG PO CAPS: 1.0000 | ORAL_CAPSULE | Freq: Two times a day (BID) | ORAL | 2 refills | Status: DC

## 2023-11-29 MED ORDER — FENOFIBRATE 145 MG PO TABS: 145.0000 mg | ORAL_TABLET | Freq: Every day | ORAL | 0 refills | Status: DC

## 2023-12-01 ENCOUNTER — Telehealth: Payer: Self-pay

## 2023-12-01 ENCOUNTER — Other Ambulatory Visit (HOSPITAL_COMMUNITY): Payer: Self-pay

## 2023-12-01 NOTE — Telephone Encounter (Signed)
 Pharmacy Patient Advocate Encounter   Received notification from CoverMyMeds that prior authorization for Jatenzo 237MG  capsules is required/requested.   Insurance verification completed.   The patient is insured through [facility] .   Per test claim: PA required; PA submitted to above mentioned insurance via CoverMyMeds Key/confirmation #/EOC (Key: [id number])   Status is pending

## 2023-12-02 NOTE — Telephone Encounter (Signed)
 Pharmacy Patient Advocate Encounter  Received notification from [facility] that Prior Authorization for Jantenzo 237MG  has been DENIED.  Full denial letter will be uploaded to the media tab. See denial reason below.   Per pts plan, pt must trial and fail 3 of Insurance pref'd drugs    PA #/Case ID/Reference #: (Key: [id number])

## 2023-12-03 ENCOUNTER — Other Ambulatory Visit: Payer: Self-pay | Admitting: Medical

## 2023-12-03 ENCOUNTER — Other Ambulatory Visit (HOSPITAL_COMMUNITY): Payer: Self-pay

## 2023-12-03 ENCOUNTER — Telehealth: Payer: Self-pay

## 2023-12-03 MED ORDER — ASPIRIN 81 MG PO TBEC: 81.0000 mg | DELAYED_RELEASE_TABLET | Freq: Every day | ORAL | 3 refills | Status: AC

## 2023-12-03 MED ORDER — TESTOSTERONE 25 MG/2.5GM (1%) TD GEL: 2.0000 | Freq: Every day | TRANSDERMAL | 1 refills | Status: DC

## 2023-12-03 NOTE — Telephone Encounter (Signed)
 Pt was notified of medication change.   Pt wants to know if you will send in Aspirin  for him

## 2023-12-03 NOTE — Telephone Encounter (Signed)
 Pharmacy Patient Advocate Encounter   Received notification from Physician's Office that prior authorization for Testosterone  25 MG/2.5GM(1%) gel is required/requested.   Insurance verification completed.   The patient is insured through [facility] .   Per test claim: PA required; PA submitted to above mentioned insurance via CoverMyMeds Key/confirmation #/EOC (Key: Jackson Donaldson)    Status is pending

## 2023-12-06 ENCOUNTER — Other Ambulatory Visit (HOSPITAL_COMMUNITY): Payer: Self-pay

## 2023-12-06 ENCOUNTER — Telehealth: Payer: Self-pay | Admitting: Medical

## 2023-12-06 NOTE — Telephone Encounter (Signed)
 Pharmacy Patient Advocate Encounter  Received notification from [facility] that Prior Authorization for Testosterone  25 MG/2.5GM(1%) gel has been APPROVED from 12/03/2023 to 12/03/2026. Ran test claim, Copay is $15.00. This test claim was processed through [facility]- copay amounts may vary at other pharmacies due to pharmacy/plan contracts, or as the patient moves through the different stages of their insurance plan.   PA #/Case ID/Reference #: [id number]

## 2023-12-06 NOTE — Telephone Encounter (Signed)
 Fax from [facility]  Testosterone  1 %  Product backordered

## 2023-12-07 ENCOUNTER — Telehealth: Payer: Self-pay | Admitting: Internal Medicine

## 2023-12-07 ENCOUNTER — Other Ambulatory Visit: Payer: Self-pay | Admitting: Surgery

## 2023-12-07 NOTE — Telephone Encounter (Signed)
 Fax from [facility]   Testosterone  1 %   Product backordered      Copied from CRM [id number]. Topic: Clinical - Prescription Issue >> Dec 07, 2023 11:49 AM Zipporah Him wrote: Reason for CRM: Patient is waiting on testosterone  gel, noted it states it was received by the pharmacy on 5/9. Patient is going to follow up again with pharmacy but in the mean time wanted to make sure there werent any issues holding it up. Please call to advise of any issues.

## 2023-12-07 NOTE — Telephone Encounter (Signed)
 Copied from CRM [id number]. Topic: Clinical - Prescription Issue >> Dec 07, 2023 11:49 AM Zipporah Him wrote: Reason for CRM: Patient is waiting on testosterone  gel, noted it states it was received by the pharmacy on 5/9. Patient is going to follow up again with pharmacy but in the mean time wanted to make sure there werent any issues holding it up. Please call to advise of any issues. >> Dec 07, 2023 12:07 PM Chuck Crater wrote: Patient stated that testosterone  gel is on national back order through the pharmacy and patient wants to know if something else can be called in that insurance will cover.

## 2023-12-09 ENCOUNTER — Other Ambulatory Visit (HOSPITAL_COMMUNITY): Payer: Self-pay

## 2023-12-09 ENCOUNTER — Telehealth: Payer: Self-pay

## 2023-12-09 ENCOUNTER — Other Ambulatory Visit: Payer: Self-pay | Admitting: Medical

## 2023-12-09 MED ORDER — TESTOSTERONE 20.25 MG/1.25GM (1.62%) TD GEL: 2.0000 | Freq: Every day | TRANSDERMAL | 2 refills | Status: DC

## 2023-12-09 MED ORDER — TESTOSTERONE 10 MG/ACT (2%) TD GEL: 2.0000 | Freq: Every day | TRANSDERMAL | 1 refills | Status: DC

## 2023-12-09 NOTE — Telephone Encounter (Signed)
 Pharmacy Patient Advocate Encounter   Received notification from Physician's Office that prior authorization for Testosterone  10 MG/ACT(2%) gel is required/requested.   Insurance verification completed.   The patient is insured through [facility] .   Per test claim: PA required; PA submitted to above mentioned insurance via CoverMyMeds Key/confirmation #/EOC (Key: [id number])   Status is pending

## 2023-12-09 NOTE — Telephone Encounter (Signed)
 Pharmacy Patient Advocate Encounter  Insurance verification completed.   The patient is insured through [employer] test claim for Testosterone  20.25mg /1.25gm (1.62%). Currently a quantity of 75g is a 30 day supply and the co-pay is Excluded/Not covered by the pts plan.        This test claim was processed through [facility]- copay amounts may vary at other pharmacies due to pharmacy/plan contracts, or as the patient moves through the different stages of their insurance plan.

## 2023-12-09 NOTE — Telephone Encounter (Signed)
 Pharmacy Patient Advocate Encounter  Received notification from [facility] that Prior Authorization for estosterone 10 MG/ACT(2%) gel has been APPROVED from 5.15.25 to 5.15.28. Ran test claim, Copay is $15.00. This test claim was processed through [facility]- copay amounts may vary at other pharmacies due to pharmacy/plan contracts, or as the patient moves through the different stages of their insurance plan.

## 2023-12-09 NOTE — Telephone Encounter (Signed)
 Pt was notified.

## 2024-01-19 ENCOUNTER — Other Ambulatory Visit: Payer: Self-pay | Admitting: Medical

## 2024-01-19 ENCOUNTER — Telehealth: Payer: Self-pay | Admitting: Medical

## 2024-01-19 DIAGNOSIS — R7989 Other specified abnormal findings of blood chemistry: Secondary | ICD-10-CM

## 2024-01-19 DIAGNOSIS — E785 Hyperlipidemia, unspecified: Secondary | ICD-10-CM

## 2024-01-19 NOTE — Telephone Encounter (Signed)
 Copied from CRM [id number]. Topic: Appointments - Scheduling Inquiry for Clinic >> Jan 19, 2024  3:49 PM Graeme ORN wrote: Reason for CRM: Patient called to reschedule appt for 6/26. Appt for lab recheck. No order showing. Thank You    ----------------------------------------------------------------------- From previous Reason for Contact - Cancel/Reschedule: Patient/patient representative is calling to cancel or reschedule an appointment. Refer to attachments for appointment information.

## 2024-01-20 ENCOUNTER — Other Ambulatory Visit

## 2024-01-25 ENCOUNTER — Other Ambulatory Visit

## 2024-01-25 DIAGNOSIS — R7989 Other specified abnormal findings of blood chemistry: Secondary | ICD-10-CM

## 2024-01-25 DIAGNOSIS — E785 Hyperlipidemia, unspecified: Secondary | ICD-10-CM

## 2024-01-26 LAB — LIPID PANEL
Chol/HDL Ratio: 4 ratio (ref 0.0–5.0)
Cholesterol, Total: 174 mg/dL (ref 100–199)
HDL: 43 mg/dL (ref >39)
LDL Chol Calc (NIH): 115 mg/dL — ABNORMAL HIGH (ref 0–99)
Triglycerides: 85 mg/dL (ref 0–149)
VLDL Cholesterol Cal: 16 mg/dL (ref 5–40)

## 2024-01-26 LAB — COMPREHENSIVE METABOLIC PANEL WITH GFR
ALT: 78 IU/L — ABNORMAL HIGH (ref 0–44)
AST: 63 IU/L — ABNORMAL HIGH (ref 0–40)
Albumin: 3.9 g/dL (ref 3.9–4.9)
Alkaline Phosphatase: 102 IU/L (ref 44–121)
BUN/Creatinine Ratio: 16 (ref 10–24)
BUN: 23 mg/dL (ref 8–27)
Bilirubin Total: 0.4 mg/dL (ref 0.0–1.2)
CO2: 20 mmol/L (ref 20–29)
Calcium: 9.3 mg/dL (ref 8.6–10.2)
Chloride: 105 mmol/L (ref 96–106)
Creatinine, Ser: 1.43 mg/dL — ABNORMAL HIGH (ref 0.76–1.27)
Globulin, Total: 4.1 g/dL (ref 1.5–4.5)
Glucose: 110 mg/dL — ABNORMAL HIGH (ref 70–99)
Potassium: 4.9 mmol/L (ref 3.5–5.2)
Sodium: 136 mmol/L (ref 134–144)
Total Protein: 8 g/dL (ref 6.0–8.5)
eGFR: 55 mL/min/{1.73_m2} — ABNORMAL LOW (ref >59)

## 2024-01-26 LAB — HEPATITIS B SURFACE ANTIGEN: Hepatitis B Surface Ag: NEGATIVE

## 2024-01-26 LAB — TESTOSTERONE: Testosterone: 219 ng/dL — AB (ref 264–916)

## 2024-01-27 ENCOUNTER — Ambulatory Visit: Payer: Self-pay | Admitting: Medical

## 2024-01-27 NOTE — Progress Notes (Signed)
 Labs relatively okay.  LDL is to still be better.  Confirm he is taking the fenofibrate  daily?  Testosterone  still low.  I assume he is using the testosterone  gel up to applications daily.  If he is then go up to 3 applications daily 1-1/2 on each arm or 1 on each upper arm and 1 on the thighs.  Rest of labs stable but abnormal for liver and kidney as prior  Given ongoing elevated liver tests and no prior ultrasound I recommend baseline ultrasound of liver and kidneys.  Let me know if agreeable  Verify medications above so I can send in appropriate refills

## 2024-02-02 ENCOUNTER — Other Ambulatory Visit: Payer: Self-pay | Admitting: Medical

## 2024-02-02 DIAGNOSIS — R7989 Other specified abnormal findings of blood chemistry: Secondary | ICD-10-CM

## 2024-02-02 MED ORDER — LOSARTAN POTASSIUM 100 MG PO TABS: 100.0000 mg | ORAL_TABLET | Freq: Every day | ORAL | 2 refills | Status: AC

## 2024-02-02 MED ORDER — FENOFIBRATE 145 MG PO TABS
145.0000 mg | ORAL_TABLET | Freq: Every day | ORAL | 2 refills | Status: AC
Start: 2024-02-02 — End: 2025-02-01

## 2024-02-02 MED ORDER — TESTOSTERONE 10 MG/ACT (2%) TD GEL: 2.0000 | Freq: Every day | TRANSDERMAL | 1 refills | Status: AC

## 2024-02-09 ENCOUNTER — Ambulatory Visit: Admitting: Internal Medicine

## 2024-02-10 ENCOUNTER — Encounter: Payer: Self-pay | Admitting: Internal Medicine

## 2024-03-01 ENCOUNTER — Other Ambulatory Visit (HOSPITAL_COMMUNITY): Payer: Self-pay

## 2024-03-06 ENCOUNTER — Telehealth: Payer: Self-pay | Admitting: Pharmacy Technician

## 2024-03-06 ENCOUNTER — Other Ambulatory Visit (HOSPITAL_COMMUNITY): Payer: Self-pay

## 2024-03-06 NOTE — Telephone Encounter (Signed)
 Pharmacy Patient Advocate Encounter   Received notification from Patient Pharmacy that prior authorization for Testosterone  1.62% pump is required/requested. (1% & 2% is on backorder per pharmacy- but they have plenty of this one)   Insurance verification completed.   The patient is insured through [employer] .   Per test claim: PA required; PA submitted to above mentioned insurance via CoverMyMeds Key/confirmation #/EOC [id number] Status is pending

## 2024-03-07 NOTE — Telephone Encounter (Signed)
 Pharmacy Patient Advocate Encounter  Received notification from [employer] that Prior Authorization for Testosterone  1.62% pump  has been DENIED.  Full denial letter will be uploaded to the media tab. See denial reason below.   PA #/Case ID/Reference #: [id number]

## 2024-03-07 NOTE — Telephone Encounter (Signed)
 From Ludie Gent, PA-  If I understand this correctly they are denying coverage for 1.62%. However the assumption is they will approve other formulations however they are on backorder at the pharmacy. So what is he supposed to do, go to a different pharmacy?

## 2024-03-08 ENCOUNTER — Other Ambulatory Visit: Payer: Self-pay | Admitting: Medical

## 2024-03-08 ENCOUNTER — Other Ambulatory Visit (HOSPITAL_COMMUNITY): Payer: Self-pay

## 2024-03-08 MED ORDER — TESTOSTERONE 20.25 MG/1.25GM (1.62%) TD GEL: 2.0000 | Freq: Every day | TRANSDERMAL | 2 refills | Status: AC

## 2024-03-08 NOTE — Telephone Encounter (Signed)
 Pharmacy Patient Advocate Encounter  Received notification from [employer] that Prior Authorization for Testosterone  1.62% pump  has been APPROVED from 03/08/2024 to 03/08/2025. Ran test claim, Copay is $15.00. This test claim was processed through [facility]- copay amounts may vary at other pharmacies due to pharmacy/plan contracts, or as the patient moves through the different stages of their insurance plan.

## 2024-03-10 ENCOUNTER — Other Ambulatory Visit: Payer: 59

## 2024-03-17 ENCOUNTER — Telehealth: Payer: Self-pay

## 2024-03-17 NOTE — Telephone Encounter (Signed)
 Final Determination    REQUEST

## 2024-03-17 NOTE — Telephone Encounter (Signed)
 Final determination (2nd test claim)   Request

## 2024-11-29 ENCOUNTER — Encounter: Payer: Self-pay | Admitting: Medical
